# Patient Record
Sex: Female | Born: 1970
Health system: Southern US, Community
[De-identification: ages and names within clinical notes are randomized; demographics above are authoritative.]

## PROBLEM LIST (undated history)

## (undated) DIAGNOSIS — O210 Mild hyperemesis gravidarum: Secondary | ICD-10-CM

## (undated) DIAGNOSIS — L570 Actinic keratosis: Secondary | ICD-10-CM

## (undated) HISTORY — PX: AUGMENTATION MAMMAPLASTY: SUR837

## (undated) HISTORY — DX: Actinic keratosis: L57.0

## (undated) HISTORY — DX: Mild hyperemesis gravidarum: O21.0

---

## 2002-07-20 ENCOUNTER — Encounter: Admission: RE | Admit: 2002-07-20 | Discharge: 2002-07-20 | Payer: Self-pay | Admitting: Family Medicine

## 2002-07-20 ENCOUNTER — Encounter: Payer: Self-pay | Admitting: Family Medicine

## 2002-11-09 ENCOUNTER — Encounter: Admission: RE | Admit: 2002-11-09 | Discharge: 2002-11-09 | Payer: Self-pay | Admitting: Family Medicine

## 2005-01-23 ENCOUNTER — Ambulatory Visit: Payer: Self-pay | Admitting: Family Medicine

## 2005-01-30 ENCOUNTER — Ambulatory Visit: Payer: Self-pay | Admitting: Family Medicine

## 2005-02-25 ENCOUNTER — Ambulatory Visit: Payer: Self-pay | Admitting: Family Medicine

## 2005-03-30 ENCOUNTER — Ambulatory Visit: Payer: Self-pay | Admitting: Family Medicine

## 2005-06-15 ENCOUNTER — Ambulatory Visit: Payer: Self-pay | Admitting: Family Medicine

## 2007-04-05 ENCOUNTER — Ambulatory Visit: Payer: Self-pay | Admitting: Family Medicine

## 2010-02-16 IMAGING — US ABDOMEN ULTRASOUND
1 series · 17 of 25 positions shown · non-contrast
Comparison: none

REASON FOR EXAM: LUQ abd pain
COMMENTS:

[Series 1: abdomen ultrasound · 17 of 65 slices shown]
[im 1/65]
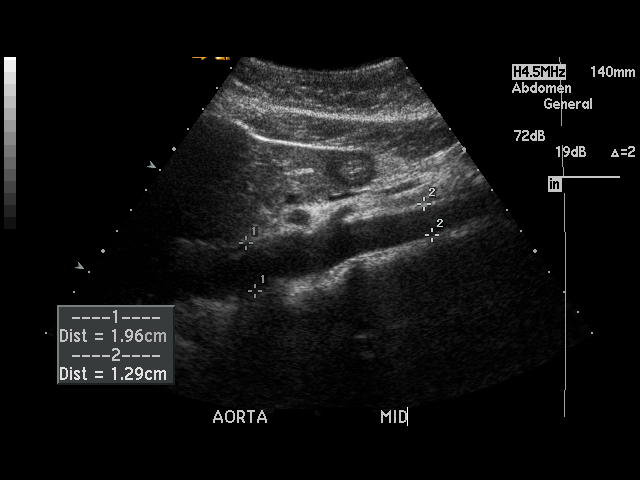
[im 6/65]
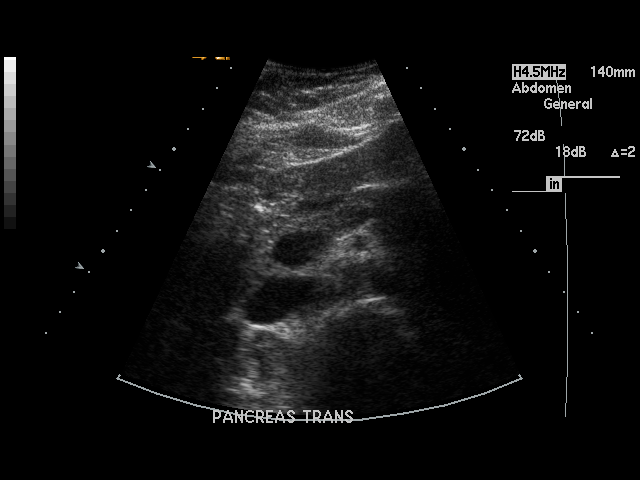
[im 9/65]
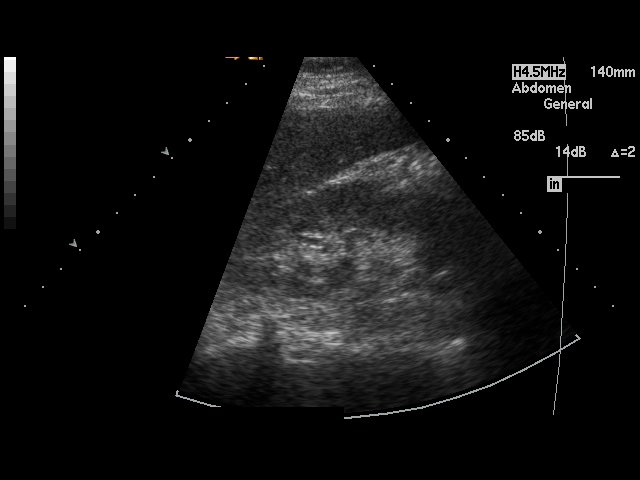
[im 14/65]
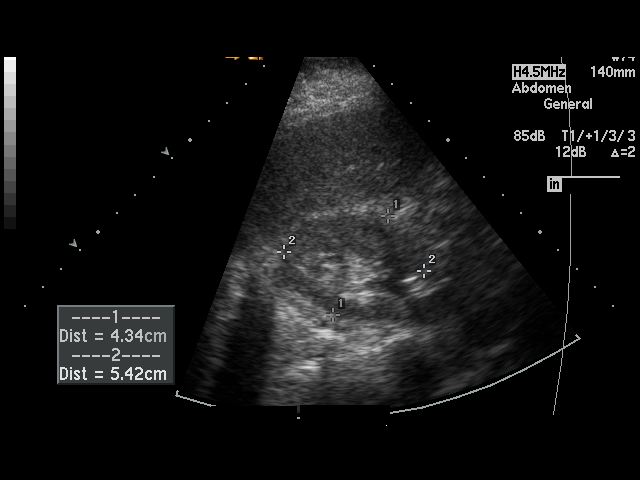
[im 17/65]
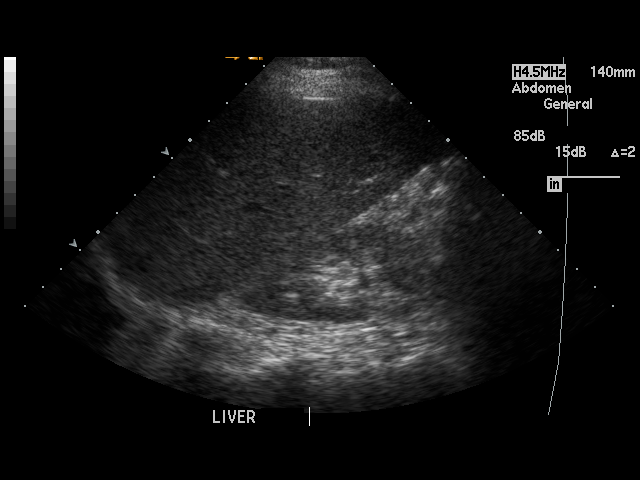
[im 22/65]
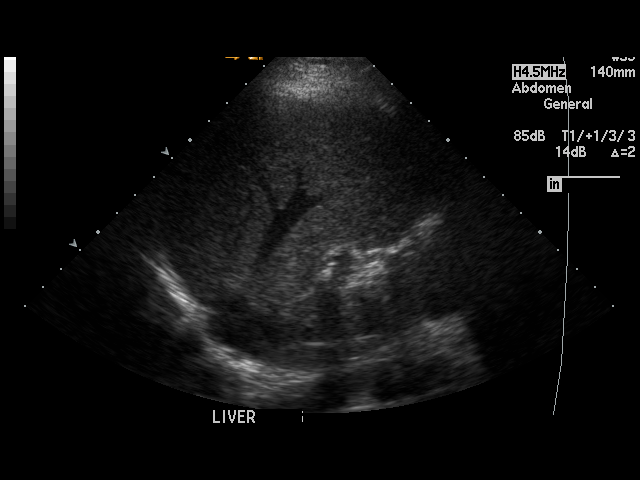
[im 25/65]
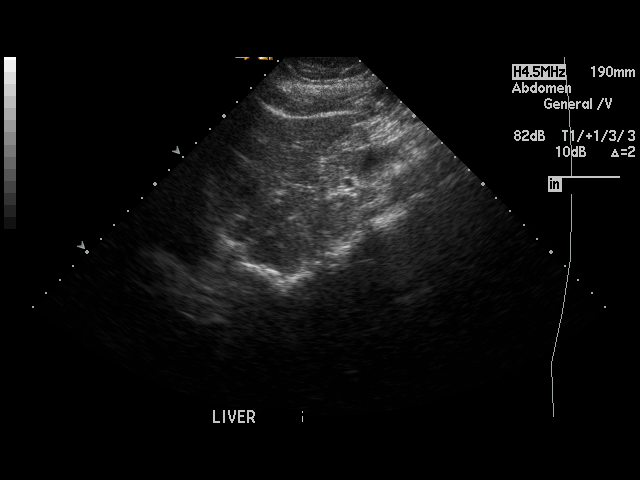
[im 30/65]
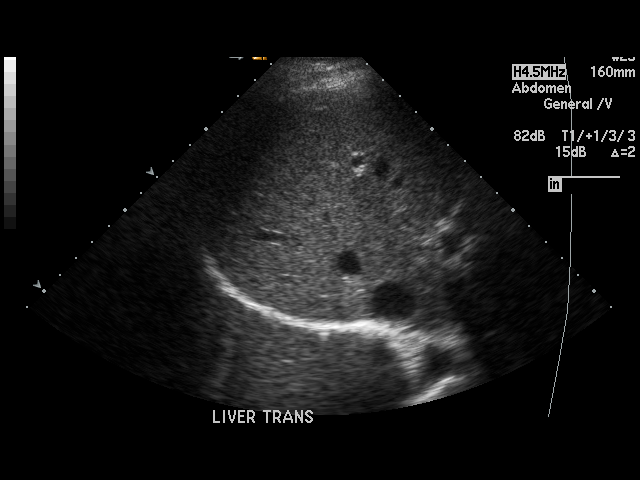
[im 33/65]
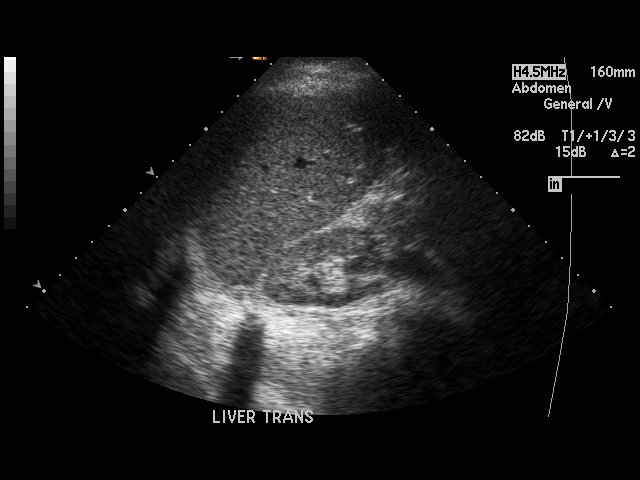
[im 35/65]
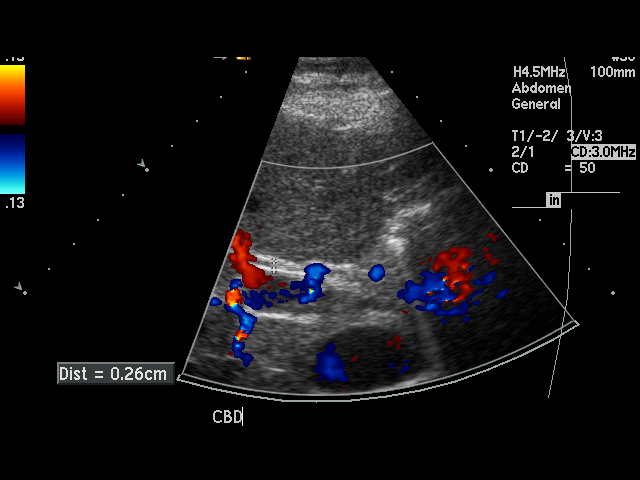
[im 41/65]
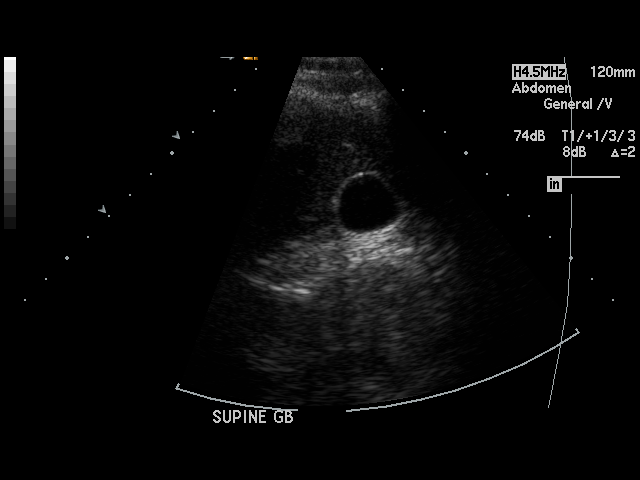
[im 43/65]
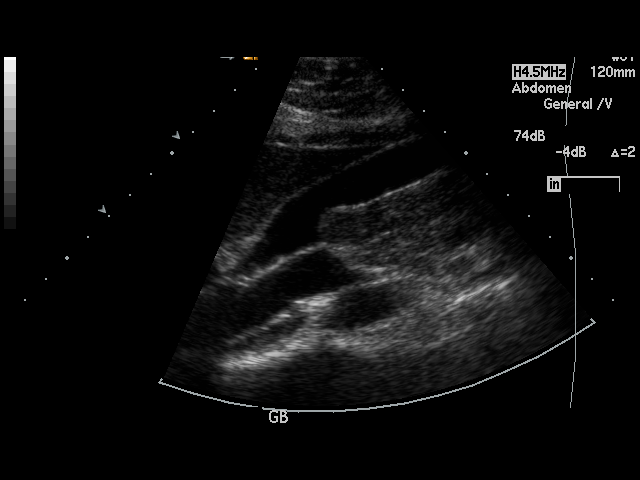
[im 49/65]
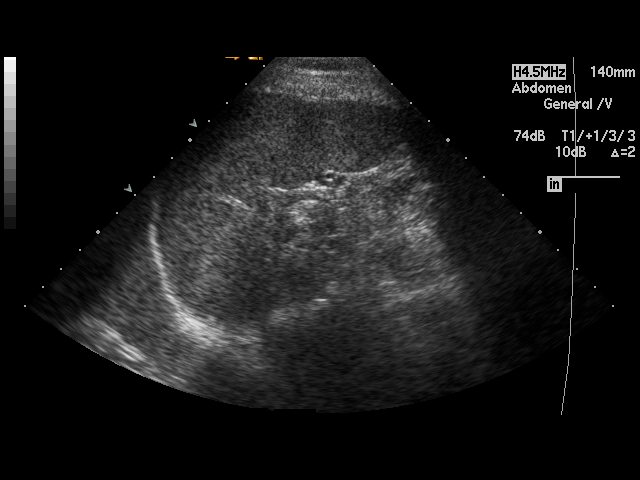
[im 51/65]
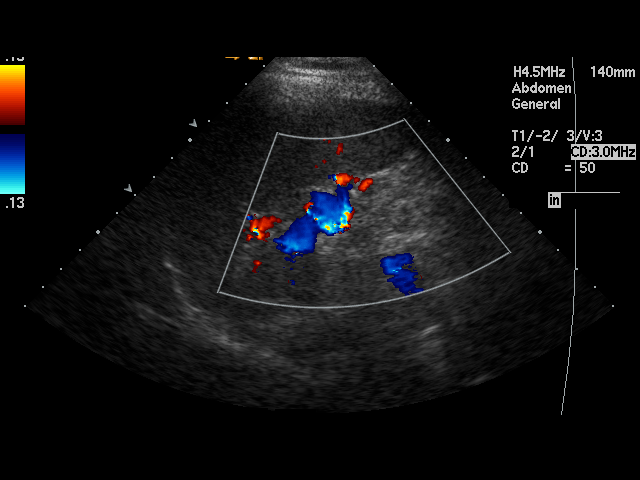
[im 57/65]
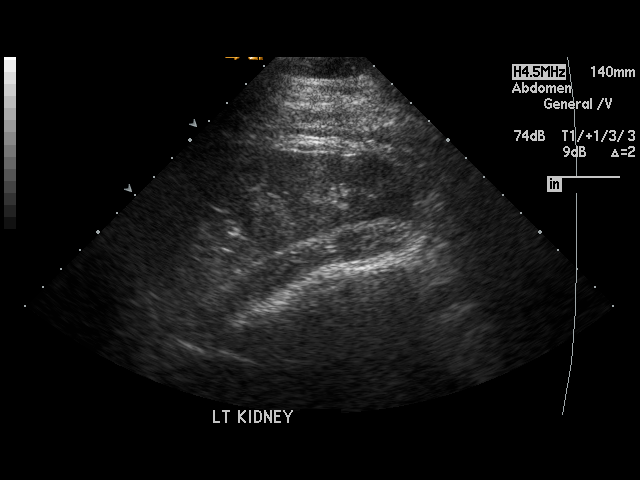
[im 59/65]
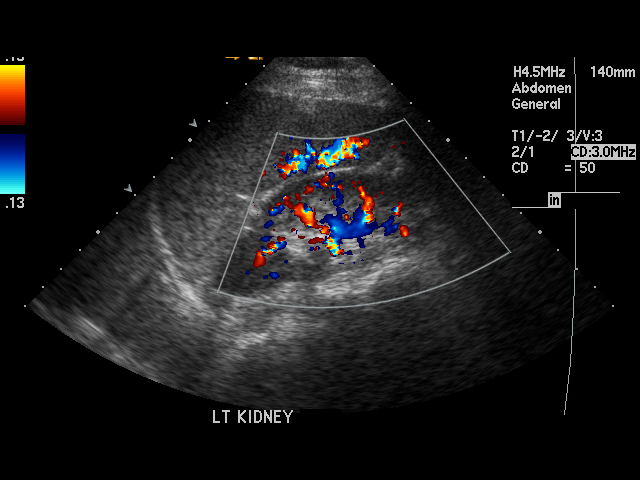
[im 65/65]
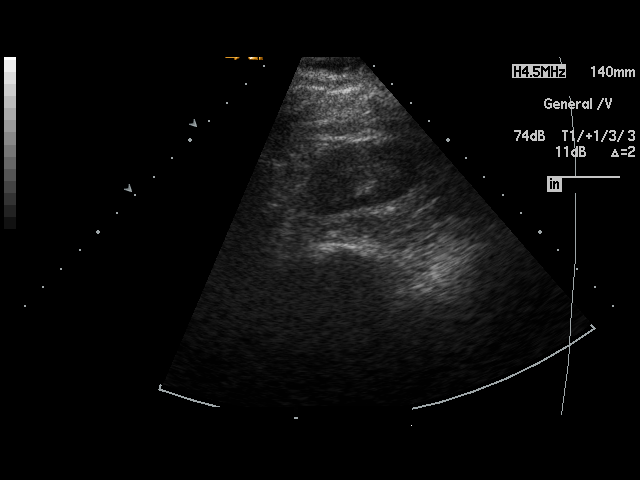

[17 of 25 positions shown; findings below may reference images not displayed]

PROCEDURE:     US  - US ABDOMEN GENERAL SURVEY  - April 05, 2007 [DATE]

RESULT:     The liver, spleen, pancreas, abdominal aorta and inferior vena
cava show no significant abnormalities. No gallstones are seen. There is no
thickening of the gallbladder wall. The common bile duct measures 2.6 mm in
diameter which is within normal limits. The kidneys show no hydronephrosis.
There is no ascites.
IMPRESSION: No significant abnormalities are noted.

## 2011-10-29 ENCOUNTER — Encounter: Payer: Self-pay | Admitting: Internal Medicine

## 2011-11-18 ENCOUNTER — Ambulatory Visit (INDEPENDENT_AMBULATORY_CARE_PROVIDER_SITE_OTHER): Payer: BC Managed Care – PPO | Admitting: Internal Medicine

## 2011-11-18 ENCOUNTER — Other Ambulatory Visit (INDEPENDENT_AMBULATORY_CARE_PROVIDER_SITE_OTHER): Payer: BC Managed Care – PPO

## 2011-11-18 ENCOUNTER — Encounter: Payer: Self-pay | Admitting: Internal Medicine

## 2011-11-18 VITALS — BP 100/60 | HR 60 | Ht 65.0 in | Wt 146.8 lb

## 2011-11-18 DIAGNOSIS — R140 Abdominal distension (gaseous): Secondary | ICD-10-CM

## 2011-11-18 DIAGNOSIS — R143 Flatulence: Secondary | ICD-10-CM

## 2011-11-18 DIAGNOSIS — R630 Anorexia: Secondary | ICD-10-CM

## 2011-11-18 DIAGNOSIS — R141 Gas pain: Secondary | ICD-10-CM

## 2011-11-18 DIAGNOSIS — R6881 Early satiety: Secondary | ICD-10-CM

## 2011-11-18 LAB — COMPREHENSIVE METABOLIC PANEL
ALT: 31 U/L (ref 0–35)
AST: 36 U/L (ref 0–37)
Albumin: 3.9 g/dL (ref 3.5–5.2)
Alkaline Phosphatase: 34 U/L — ABNORMAL LOW (ref 39–117)
BUN: 7 mg/dL (ref 6–23)
CO2: 27 mEq/L (ref 19–32)
Calcium: 9.2 mg/dL (ref 8.4–10.5)
Chloride: 107 mEq/L (ref 96–112)
Creatinine, Ser: 0.7 mg/dL (ref 0.4–1.2)
GFR: 95.53 mL/min (ref >60.00)
Glucose, Bld: 73 mg/dL (ref 70–99)
Potassium: 4.5 mEq/L (ref 3.5–5.1)
Sodium: 141 mEq/L (ref 135–145)
Total Bilirubin: 0.6 mg/dL (ref 0.3–1.2)
Total Protein: 6.7 g/dL (ref 6.0–8.3)

## 2011-11-18 LAB — CBC WITH DIFFERENTIAL/PLATELET
Basophils Absolute: 0 10*3/uL (ref 0.0–0.1)
Basophils Relative: 0.5 % (ref 0.0–3.0)
Eosinophils Absolute: 0 10*3/uL (ref 0.0–0.7)
Eosinophils Relative: 0.6 % (ref 0.0–5.0)
HCT: 39.8 % (ref 36.0–46.0)
Hemoglobin: 13.1 g/dL (ref 12.0–15.0)
Lymphocytes Relative: 30.8 % (ref 12.0–46.0)
Lymphs Abs: 1.8 10*3/uL (ref 0.7–4.0)
MCHC: 32.8 g/dL (ref 30.0–36.0)
MCV: 89.3 fl (ref 78.0–100.0)
Monocytes Absolute: 0.6 10*3/uL (ref 0.1–1.0)
Monocytes Relative: 9.9 % (ref 3.0–12.0)
Neutro Abs: 3.4 10*3/uL (ref 1.4–7.7)
Neutrophils Relative %: 58.2 % (ref 43.0–77.0)
Platelets: 176 10*3/uL (ref 150.0–400.0)
RBC: 4.46 Mil/uL (ref 3.87–5.11)
RDW: 12.8 % (ref 11.5–14.6)
WBC: 5.8 10*3/uL (ref 4.5–10.5)

## 2011-11-18 LAB — TSH: TSH: 0.61 u[IU]/mL (ref 0.35–5.50)

## 2011-11-18 LAB — IGA: IgA: 151 mg/dL (ref 68–378)

## 2011-11-18 NOTE — Progress Notes (Signed)
Subjective:    Patient ID: Kirsten Fletcher, female    DOB: [birth year], 41 y.o.   MRN: [id number]  HPI This is a pleasant married 41 year old white woman who was self-referred for evaluation of bloating. She reports years of issues with early satiety and bloating and distention. He cannot remember a specific change she feels like her some it has always been off and not quite normal. Over the past 2 years she has essentially eliminated most meat from her diet because it is especially bad trigger of this problem. Greasy beef foods seem to be worse, such as a hamburger from a fast food restaurant. She eats and bloating the stands like a balloon she says. She has lost her appetite over time as well though she's never had a strong appetite she has anorexia and can go for hours and forced herself to eat so she doesn't get headaches. She just simply does not get hungry. In the past year or 2 she had some intermittent sharp left upper quadrant pain and did go to an urgent care weren't acid blocking medicine was prescribed, she doesn't room which but that just dissipated on its own she thinks. She eats fiber 1 bars intends to move her bowels without too much difficulty the there could be occasional constipation. There is no rectal bleeding or melena, no nausea or vomiting. She did say she had hyperemesis gravidarum during 2 pregnancies and actually had difficulty maintaining weight. She has never seen a gastroenterologist before. The problems with her diet, are such that it's impairing her quality of life because it's difficult to find something to eat at restaurants, and she simply does not like the way she feels when she has bloating and distention which is worse with meats but has not exclusively related to that.  Allergies  Allergen Reactions  . Shellfish Allergy Anaphylaxis    Swelling of throat  . Latex Rash   No outpatient prescriptions prior to visit.    Last reviewed on 11/18/2011  9:35 AM by Iva Boop, MD Past Medical History  Diagnosis Date  . Hyperemesis gravidarum    History reviewed. No pertinent past surgical history. History   Social History  . Marital Status: Married         Number of Children: 2  .     Occupational History  . real estate agent    Social History Main Topics  . Smoking status: Never Smoker   . Smokeless tobacco: Never Used  . Alcohol Use: No  . Drug Use: No    Social History Narrative   married, 2 daughters, never smoker no alcohol no caffeine, she is a Veterinary surgeon   Family History  Problem Relation Age of Onset  . Diabetes Mother   . Colon cancer Neg Hx   . Diabetes Father           Review of Systems She says she has excessive compulsive tendencies or even disorder. She's not treated for that. She has insomnia and difficulty initiating or maintaining sleep and sleeps about 3-4 hours at night. Remainder of her review of systems is otherwise negative or as per history of present illness. Denies significant stress or anxiety.    Objective:   Physical Exam General:  Well-developed, well-nourished and in no acute distress Eyes:  anicteric. ENT:   Mouth and posterior pharynx free of lesions.  Neck:   supple w/o thyromegaly or mass.  Lungs: Clear to auscultation bilaterally. Heart:  S1S2, no rubs, murmurs,  gallops. Abdomen:  soft, non-tender, no hepatosplenomegaly, hernia, or mass and BS+.  Lymph:  no cervical or supraclavicular adenopathy. Extremities:   no edema Skin   no rash. Neuro:  A&O x 3.  Psych:  appropriate mood and  Affect.      Assessment & Plan:   1. Early satiety   2. Bloating   3. Anorexia    Differential diagnosis is wide of the sounds like some sort of functional dyspepsia most likely. She could have some mild pancreatic insufficiency issues given the meat and tolerance. It does not sound so much like an allergy problem as I would expect the symptoms to be more severe perhaps have swelling or vomiting problems.  He does not have worrisome features like bleeding, dysphagia or unintentional weight loss. In fact her weight has been stable. She could be anemic, lab testing is pending.  1. CBC, comprehensive metabolic panel, TSH, tissue transglutaminase antibody and IgA level rechecked. 2. Her other plans pending those results. If she were anemic an upper endoscopy would be indicated. Additional lab testing as well. 3. I did explain how buspirone is a good treatment for functional dyspepsia with a strong early satiety and bloating component.

## 2011-11-18 NOTE — Patient Instructions (Addendum)
Your physician has requested that you go to the basement for the following lab work before leaving today: CBC/diff, CMET, TSH, IGA, TTG  We will be in touch with results and plans.  Thank you for choosing me and [facility].  Iva Boop, M.D., [facility]

## 2011-11-19 LAB — TISSUE TRANSGLUTAMINASE, IGA: Tissue Transglutaminase Ab, IgA: 2.2 U/mL (ref <20)

## 2011-11-19 NOTE — Progress Notes (Signed)
Quick Note:  Labs all ok - no evidence of celiac disease Please let her know I recommend buspirone 10 mg bid start with 1/2 tablet forst week #60 2 refills REV 6-8weeks  This is the medicine I talked to her about at visit - can help mitigate feelings of early satiety and bloating even though originally an anti-anxiety medication ______

## 2011-11-20 ENCOUNTER — Telehealth: Payer: Self-pay

## 2011-11-20 MED ORDER — BUSPIRONE HCL 10 MG PO TABS: ORAL_TABLET | ORAL | Status: DC

## 2011-11-20 NOTE — Telephone Encounter (Signed)
Notified patient of lab results. Told her that Dr. Leone Payor wanted her to start buspirone that they discussed at there last office visit. Pt agreed and medication sent to her pharmacy. Also scheduled appt for 01/11/12 at 1:30pm.

## 2011-11-20 NOTE — Telephone Encounter (Signed)
Message copied by Jessee Avers on Fri Nov 20, 2011 10:28 AM ------      Message from: Stan Head E      Created: Thu Nov 19, 2011  2:47 PM       Labs all ok - no evidence of celiac disease      Please let her know      I recommend buspirone 10 mg bid start with 1/2 tablet forst week #60 2 refills      REV 6-8weeks            This is the medicine I talked to her about at visit - can help mitigate feelings of early satiety and bloating even though originally an anti-anxiety medication

## 2012-01-11 ENCOUNTER — Ambulatory Visit: Payer: BC Managed Care – PPO | Admitting: Internal Medicine

## 2012-02-27 ENCOUNTER — Other Ambulatory Visit: Payer: Self-pay

## 2012-07-20 DIAGNOSIS — C4491 Basal cell carcinoma of skin, unspecified: Secondary | ICD-10-CM

## 2012-07-20 HISTORY — DX: Basal cell carcinoma of skin, unspecified: C44.91

## 2012-11-17 ENCOUNTER — Other Ambulatory Visit: Payer: Self-pay

## 2013-10-05 DIAGNOSIS — C4492 Squamous cell carcinoma of skin, unspecified: Secondary | ICD-10-CM

## 2013-10-05 HISTORY — DX: Squamous cell carcinoma of skin, unspecified: C44.92

## 2013-11-13 ENCOUNTER — Encounter: Payer: Self-pay | Admitting: Internal Medicine

## 2015-05-01 ENCOUNTER — Encounter: Payer: Self-pay | Admitting: Family Medicine

## 2015-05-01 ENCOUNTER — Ambulatory Visit (INDEPENDENT_AMBULATORY_CARE_PROVIDER_SITE_OTHER): Payer: 59 | Admitting: Family Medicine

## 2015-05-01 VITALS — BP 104/68 | HR 70 | Temp 97.0°F | Ht 65.0 in | Wt 133.4 lb

## 2015-05-01 DIAGNOSIS — Z91013 Allergy to seafood: Secondary | ICD-10-CM | POA: Diagnosis not present

## 2015-05-01 DIAGNOSIS — Z833 Family history of diabetes mellitus: Secondary | ICD-10-CM

## 2015-05-01 DIAGNOSIS — Z13220 Encounter for screening for lipoid disorders: Secondary | ICD-10-CM | POA: Diagnosis not present

## 2015-05-01 MED ORDER — EPINEPHRINE 0.3 MG/0.3ML IJ SOAJ: 0.3000 mg | Freq: Once | INTRAMUSCULAR | Status: DC

## 2015-05-01 NOTE — Assessment & Plan Note (Signed)
Patient with shellfish allergy and possible allergy to some unknown quantity in what she ate over the weekend. She is asymptomatic at this time. Vital signs are stable. Discussed that she would likely need a EpiPen to have at home and this was prescribed to her. She brought up whether or not she needs allergy testing I offered her referral to an allergist and we decided to hold off on this at this time. She will continue as needed Benadryl. She will monitor for recurrence of symptoms. I advised that when she eats out she needs to inform them that she has a shellfish allergy. She will continue to monitor.

## 2015-05-01 NOTE — Progress Notes (Signed)
Pre visit review using our clinic review tool, if applicable. No additional management support is needed unless otherwise documented below in the visit note. 

## 2015-05-01 NOTE — Patient Instructions (Signed)
Nice to meet you. We are going to give you an EpiPen to use as needed for allergic reactions. If you develop tongue swelling, throat swelling, lip swelling, tingling in her mouth, trouble breathing, or trouble swallowing please use the EpiPen. You can also use Benadryl for this. If you develop throat swelling, lip swelling, tongue swelling, tingling in her mouth, trouble breathing, trouble swallowing, or any new or changing symptoms please seek medical attention.

## 2015-05-01 NOTE — Progress Notes (Signed)
Patient ID: Kirsten Fletcher, female   DOB: 09-Apr-1970, 45 y.o.   MRN: [id number]  Kirsten Alar, MD Phone: [phone]  Kirsten Fletcher is a 45 y.o. female who presents today for new patient visit.  Allergic reaction: Patient notes on Saturday she woke up in the middle the night with an allergic reaction. She notes she had some throat swelling, lip swelling, tongue swelling, and trouble breathing. She notes she took a Benadryl and the symptoms resolved with this. Symptoms lasted total of 1.5 hours. She has a history of allergies to shrimp and other shellfish. She had eaten nachos with pimento cheese and a sandwich. She is unsure what she reacted to. She notes no symptoms at this time. She has never had an EpiPen. Her symptoms have always resolved with taking Benadryl.  Active Ambulatory Problems    Diagnosis Date Noted  . Shellfish allergy 05/01/2015   Resolved Ambulatory Problems    Diagnosis Date Noted  . No Resolved Ambulatory Problems   Past Medical History  Diagnosis Date  . Hyperemesis gravidarum     Family History  Problem Relation Age of Onset  . Diabetes Mother   . Colon cancer Neg Hx   . Diabetes Father   . Diabetes Maternal Grandmother   . Diabetes Maternal Grandfather   . Diabetes Paternal Grandmother   . Diabetes Paternal Grandfather     Social History   Social History  . Marital Status: Married    Spouse Name: N/A  . Number of Children: 2  . Years of Education: N/A   Occupational History  . real estate agent    Social History Main Topics  . Smoking status: Never Smoker   . Smokeless tobacco: Never Used  . Alcohol Use: No  . Drug Use: No  . Sexual Activity: Not on file   Other Topics Concern  . Not on file   Social History Narrative   married, 2 daughters, never smoker no alcohol no caffeine, she is a Veterinary surgeon    ROS  General:  Negative for nexplained weight loss, fever Skin: Negative for new or changing mole, sore that won't heal HEENT: Negative  for trouble hearing, trouble seeing, ringing in ears, mouth sores, hoarseness, change in voice, dysphagia. CV:  Negative for chest pain, edema, palpitations Resp: Positive for dyspnea, Negative for cough, hemoptysis GI: Positive for nausea, negative for vomiting, diarrhea, constipation, abdominal pain, melena, hematochezia. GU: Negative for dysuria, incontinence, urinary hesitance, hematuria, vaginal or penile discharge, polyuria, sexual difficulty, lumps in testicle or breasts MSK: Negative for muscle cramps or aches, joint pain or swelling Neuro: Negative for headaches, weakness, numbness, dizziness, passing out/fainting Psych: Negative for depression, anxiety, memory problems  Objective  Physical Exam Filed Vitals:   05/01/15 1026  BP: 104/68  Pulse: 70  Temp: 97 F (36.1 C)    BP Readings from Last 3 Encounters:  05/01/15 104/68  11/18/11 100/60   Wt Readings from Last 3 Encounters:  05/01/15 133 lb 6.4 oz (60.51 kg)  11/18/11 146 lb 12.8 oz (66.588 kg)    Physical Exam  Constitutional: She is well-developed, well-nourished, and in no distress.  HENT:  Head: Normocephalic and atraumatic.  Right Ear: External ear normal.  Left Ear: External ear normal.  Mouth/Throat: Oropharynx is clear and moist. No oropharyngeal exudate.  No tongue or lip enlargement  Eyes: Conjunctivae are normal. Pupils are equal, round, and reactive to light.  Neck: Neck supple.  Cardiovascular: Normal rate, regular rhythm and normal heart sounds.  Pulmonary/Chest: Effort normal and breath sounds normal.  Abdominal: Soft. Bowel sounds are normal. She exhibits no distension. There is no tenderness. There is no rebound and no guarding.  Musculoskeletal: She exhibits no edema.  Lymphadenopathy:    She has no cervical adenopathy.  Neurological: She is alert. Gait normal.  Skin: Skin is warm and dry. She is not diaphoretic.  Psychiatric: Mood and affect normal.     Assessment/Plan:   Shellfish  allergy Patient with shellfish allergy and possible allergy to some unknown quantity in what she ate over the weekend. She is asymptomatic at this time. Vital signs are stable. Discussed that she would likely need a EpiPen to have at home and this was prescribed to her. She brought up whether or not she needs allergy testing I offered her referral to an allergist and we decided to hold off on this at this time. She will continue as needed Benadryl. She will monitor for recurrence of symptoms. I advised that when she eats out she needs to inform them that she has a shellfish allergy. She will continue to monitor.    Orders Placed This Encounter  Procedures  . Comp Met (CMET)    Standing Status: Future     Number of Occurrences:      Standing Expiration Date: 04/30/2016  . HgB A1c    Standing Status: Future     Number of Occurrences:      Standing Expiration Date: 04/30/2016  . Lipid Profile    Standing Status: Future     Number of Occurrences:      Standing Expiration Date: 04/30/2016    Meds ordered this encounter  Medications  . EPINEPHrine 0.3 mg/0.3 mL IJ SOAJ injection    Sig: Inject 0.3 mLs (0.3 mg total) into the muscle once.    Dispense:  2 Device    Refill:  0     Kirsten Rumps, MD [place]

## 2015-05-24 ENCOUNTER — Other Ambulatory Visit (INDEPENDENT_AMBULATORY_CARE_PROVIDER_SITE_OTHER): Payer: 59

## 2015-05-24 DIAGNOSIS — Z13220 Encounter for screening for lipoid disorders: Secondary | ICD-10-CM | POA: Diagnosis not present

## 2015-05-24 DIAGNOSIS — Z833 Family history of diabetes mellitus: Secondary | ICD-10-CM | POA: Diagnosis not present

## 2015-05-24 LAB — COMPREHENSIVE METABOLIC PANEL
ALT: 22 U/L (ref 0–35)
AST: 24 U/L (ref 0–37)
Albumin: 4.1 g/dL (ref 3.5–5.2)
Alkaline Phosphatase: 33 U/L — ABNORMAL LOW (ref 39–117)
BUN: 10 mg/dL (ref 6–23)
CO2: 26 mEq/L (ref 19–32)
Calcium: 9.2 mg/dL (ref 8.4–10.5)
Chloride: 108 mEq/L (ref 96–112)
Creatinine, Ser: 0.89 mg/dL (ref 0.40–1.20)
GFR: 73.52 mL/min (ref >60.00)
Glucose, Bld: 86 mg/dL (ref 70–99)
Potassium: 4.3 mEq/L (ref 3.5–5.1)
Sodium: 140 mEq/L (ref 135–145)
Total Bilirubin: 0.7 mg/dL (ref 0.2–1.2)
Total Protein: 6.6 g/dL (ref 6.0–8.3)

## 2015-05-24 LAB — LIPID PANEL
Cholesterol: 190 mg/dL (ref 0–200)
HDL: 61.9 mg/dL (ref >39.00)
LDL Cholesterol: 115 mg/dL — ABNORMAL HIGH (ref 0–99)
NonHDL: 128.52
Total CHOL/HDL Ratio: 3
Triglycerides: 70 mg/dL (ref 0.0–149.0)
VLDL: 14 mg/dL (ref 0.0–40.0)

## 2015-05-24 LAB — HEMOGLOBIN A1C: Hgb A1c MFr Bld: 5.1 % (ref 4.6–6.5)

## 2015-05-29 ENCOUNTER — Encounter: Payer: Self-pay | Admitting: Family Medicine

## 2015-05-31 ENCOUNTER — Ambulatory Visit (INDEPENDENT_AMBULATORY_CARE_PROVIDER_SITE_OTHER): Payer: 59 | Admitting: Family Medicine

## 2015-05-31 ENCOUNTER — Encounter: Payer: Self-pay | Admitting: Family Medicine

## 2015-05-31 VITALS — BP 104/70 | HR 70 | Temp 98.0°F | Ht 65.0 in | Wt 133.2 lb

## 2015-05-31 DIAGNOSIS — Z23 Encounter for immunization: Secondary | ICD-10-CM

## 2015-05-31 DIAGNOSIS — Z0001 Encounter for general adult medical examination with abnormal findings: Secondary | ICD-10-CM

## 2015-05-31 DIAGNOSIS — Z0000 Encounter for general adult medical examination without abnormal findings: Secondary | ICD-10-CM | POA: Insufficient documentation

## 2015-05-31 DIAGNOSIS — R6889 Other general symptoms and signs: Secondary | ICD-10-CM | POA: Diagnosis not present

## 2015-05-31 MED ORDER — WRIST SPLINT/COCK-UP/RIGHT M MISC: Status: DC

## 2015-05-31 NOTE — Progress Notes (Signed)
Pre visit review using our clinic review tool, if applicable. No additional management support is needed unless otherwise documented below in the visit note. 

## 2015-05-31 NOTE — Patient Instructions (Signed)
Nice to see you. Please continue to exercise and monitor your diet. Please follow-up with her gynecologist for yearly mammogram and Pap smear. Please try wrapping a towel around your elbows or keeping a towel between your elbows on both sides to help prevent irritation of your ulnar nerve. You can also try wearing the cockup splint on your right wrist.

## 2015-05-31 NOTE — Assessment & Plan Note (Signed)
Overall doing quite well. BMI is in the normal range. She'll continue diet and exercise. Tetanus vaccine was updated today. She will continue to obtain her Pap smears and mammograms through her gynecologist. I encouraged her to continue to discuss her menstrual cycle with them. Deferred HIV testing today. Suspect the numbness that she gets in her hands is related to positional issues and ulnar nerve entrapment at her elbows and possibly carpal tunnel on the right wrist. I discussed wrapping her elbows in towels or sleeping with a pillow in the elbow area to prevent further entrapment and also try to leave her arms extended as much as possible. Also provided her with a prescription for a splint to use if necessary for her right wrist. If no improvement she'll follow-up. She is given return precautions.

## 2015-05-31 NOTE — Progress Notes (Signed)
Patient ID: IVELIS MIMBS, female   DOB: 24-May-1970, 45 y.o.   MRN: [id number]  Kirsten Rumps, MD Phone: [phone]  Kirsten Fletcher is a 45 y.o. female who presents today for physical exam.  Patient exercises by running and doing high-intensity workouts 3-5 days a week. Diet is described as good. No added sugar. No soda or sweet tea. He eats lots of vegetables. No recent tetanus vaccine. She has her mammograms and Pap smears managed through her gynecologist. First menstrual period at age 63. Some irregularity to her periods and that sometimes it just spotting and sometimes it is normal. She is seen by her gynecologist for this and they've advised to monitor. No illicit drug use. No alcohol use. No tobacco use. No prior HIV screening.  Does note when she sleeps at night occasionally her hands will go numb based on the position that she sleeps in. She wakes up and stretches her arms out and the numbness goes away. No pain in her elbows or wrists. No other numbness or weakness elsewhere. Weekly occurs 3 nights a week.  Active Ambulatory Problems    Diagnosis Date Noted  . Shellfish allergy 05/01/2015  . Encounter for general adult medical examination with abnormal findings 05/31/2015   Resolved Ambulatory Problems    Diagnosis Date Noted  . No Resolved Ambulatory Problems   Past Medical History  Diagnosis Date  . Hyperemesis gravidarum     Family History  Problem Relation Age of Onset  . Diabetes Mother   . Colon cancer Neg Hx   . Diabetes Father   . Diabetes Maternal Grandmother   . Diabetes Maternal Grandfather   . Diabetes Paternal Grandmother   . Diabetes Paternal Grandfather     Social History   Social History  . Marital Status: Married    Spouse Name: N/A  . Number of Children: 2  . Years of Education: N/A   Occupational History  . real estate agent    Social History Main Topics  . Smoking status: Never Smoker   . Smokeless tobacco: Never Used  . Alcohol Use:  No  . Drug Use: No  . Sexual Activity: Not on file   Other Topics Concern  . 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 CV:  Negative for chest pain, dyspnea, edema, palpitations Resp: Negative for cough, dyspnea, hemoptysis GI: Negative for nausea, vomiting, diarrhea, constipation, abdominal pain, melena, hematochezia. GU: Negative for dysuria, incontinence, urinary hesitance, hematuria, vaginal or penile discharge, polyuria, sexual difficulty, lumps in testicle or breasts MSK: Negative for muscle cramps or aches, joint pain or swelling Neuro: Positive for numbness, Negative for headaches, weakness, dizziness, passing out/fainting Psych: Negative for depression, anxiety, memory problems  Objective  Physical Exam Filed Vitals:   05/31/15 0929  BP: 104/70  Pulse: 70  Temp: 98 F (36.7 C)    BP Readings from Last 3 Encounters:  05/31/15 104/70  05/01/15 104/68  11/18/11 100/60   Wt Readings from Last 3 Encounters:  05/31/15 133 lb 3.2 oz (60.419 kg)  05/01/15 133 lb 6.4 oz (60.51 kg)  11/18/11 146 lb 12.8 oz (66.588 kg)    Physical Exam  Constitutional:  She is well-developed, well-nourished, and in no distress.  HENT:  Head: Normocephalic and atraumatic.  Right Ear: External ear normal.  Left Ear: External ear normal.  Mouth/Throat: Oropharynx is clear and moist.  Eyes: Conjunctivae are normal. Pupils are equal, round, and reactive to light.  Neck: Neck supple.  Cardiovascular: Normal rate, regular rhythm and normal heart sounds.   Pulmonary/Chest: Effort normal and breath sounds normal.  Abdominal: Soft. Bowel sounds are normal. She exhibits no  distension. There is no tenderness. There is no rebound and no guarding.  Musculoskeletal:  No elbow or wrist tenderness bilaterally, no elbow or wrist swelling bilaterally, positive Tinel's of bilateral ulnar grooves, positive Tinel's at right wrist, negative Tinel's at left wrist  Lymphadenopathy:    She has no cervical adenopathy.  Neurological: She is alert.  CN 2-12 intact, 5/5 strength in bilateral biceps, triceps, grip, quads, hamstrings, plantar and dorsiflexion, sensation to light touch intact in bilateral UE and LE, normal gait, 2+ patellar reflexes  Skin: Skin is warm and dry. She is not diaphoretic.  Psychiatric: Mood and affect normal.     Assessment/Plan:   Encounter for general adult medical examination with abnormal findings Overall doing quite well. BMI is in the normal range. She'll continue diet and exercise. Tetanus vaccine was updated today. She will continue to obtain her Pap smears and mammograms through her gynecologist. I encouraged her to continue to discuss her menstrual cycle with them. Deferred HIV testing today. Suspect the numbness that she gets in her hands is related to positional issues and ulnar nerve entrapment at her elbows and possibly carpal tunnel on the right wrist. I discussed wrapping her elbows in towels or sleeping with a pillow in the elbow area to prevent further entrapment and also try to leave her arms extended as much as possible. Also provided her with a prescription for a splint to use if necessary for her right wrist. If no improvement she'll follow-up. She is given return precautions.    Orders Placed This Encounter  Procedures  . Tdap vaccine greater than or equal to 7yo IM    Kirsten Rumps, MD [place]

## 2015-06-27 DIAGNOSIS — D239 Other benign neoplasm of skin, unspecified: Secondary | ICD-10-CM

## 2015-06-27 HISTORY — DX: Other benign neoplasm of skin, unspecified: D23.9

## 2016-07-12 DIAGNOSIS — L299 Pruritus, unspecified: Secondary | ICD-10-CM | POA: Diagnosis not present

## 2016-07-12 DIAGNOSIS — R42 Dizziness and giddiness: Secondary | ICD-10-CM | POA: Diagnosis not present

## 2016-07-12 DIAGNOSIS — T673XXA Heat exhaustion, anhydrotic, initial encounter: Secondary | ICD-10-CM | POA: Diagnosis not present

## 2016-07-12 DIAGNOSIS — T7840XA Allergy, unspecified, initial encounter: Secondary | ICD-10-CM | POA: Diagnosis not present

## 2016-07-12 DIAGNOSIS — R55 Syncope and collapse: Secondary | ICD-10-CM | POA: Diagnosis not present

## 2016-07-12 DIAGNOSIS — R0602 Shortness of breath: Secondary | ICD-10-CM | POA: Diagnosis not present

## 2016-07-13 DIAGNOSIS — R55 Syncope and collapse: Secondary | ICD-10-CM | POA: Diagnosis not present

## 2016-08-11 DIAGNOSIS — Z01419 Encounter for gynecological examination (general) (routine) without abnormal findings: Secondary | ICD-10-CM | POA: Diagnosis not present

## 2016-08-11 DIAGNOSIS — Z1231 Encounter for screening mammogram for malignant neoplasm of breast: Secondary | ICD-10-CM | POA: Diagnosis not present

## 2016-08-11 LAB — HM PAP SMEAR: HM Pap smear: NEGATIVE

## 2016-08-12 ENCOUNTER — Encounter: Payer: Self-pay | Admitting: Family Medicine

## 2016-08-12 ENCOUNTER — Ambulatory Visit (INDEPENDENT_AMBULATORY_CARE_PROVIDER_SITE_OTHER): Payer: 59 | Admitting: Family Medicine

## 2016-08-12 VITALS — BP 100/64 | HR 71 | Temp 98.7°F | Wt 140.4 lb

## 2016-08-12 DIAGNOSIS — R002 Palpitations: Secondary | ICD-10-CM | POA: Diagnosis not present

## 2016-08-12 LAB — TSH: TSH: 1.53 u[IU]/mL (ref 0.35–4.50)

## 2016-08-12 NOTE — Assessment & Plan Note (Signed)
Patient with several episodes of palpitations and some slight exertional shortness of breath over the last month or so. Evaluation in the ED was unremarkable. That episode could have been related to heat exhaustion though she has had some persistent symptoms. Lab work and EKG was reassuring then. We'll check a TSH as this was not checked. Given persistence of symptoms we'll refer to cardiology. She is given return precautions.

## 2016-08-12 NOTE — Patient Instructions (Signed)
Nice to see you. We'll check a TSH today. We'll get you to see cardiology. If you develop recurrent dyspnea, or develop chest pain or persistent palpitations please be evaluated.

## 2016-08-12 NOTE — Progress Notes (Signed)
  Tommi Rumps, MD Phone: [phone]  Kirsten Fletcher is a 46 y.o. female who presents today for follow-up.  Patient recently seen in the ED at [facility] for shortness of breath. She developed sudden onset of feeling as though she couldn't breathe. Felt as though she was going to pass out as well. She did not pass out. She notes since then she's had some slight dyspnea on exertion at times though other times is fine. Finds it a little harder to breathe at those times. She has yes not had any recurrent episodes like she did that required an emergency room visit. She notes no chest pain. Occasionally does feel a flutter in her chest when she lays down but not when she is active. Lasts for about a second and then has a little trouble breathing and then it resolves. No family history of cardiac issues or pulmonary issues. No history of asthma and the patient. Her lab work was unremarkable in the emergency room and they treated her for heat exhaustion with IV fluids. ED note reviewed. Lab work reviewed as well.  PMH: nonsmoker.   ROS see history of present illness  Objective  Physical Exam Vitals:   08/12/16 0847  BP: 100/64  Pulse: 71  Temp: 98.7 F (37.1 C)    BP Readings from Last 3 Encounters:  08/12/16 100/64  05/31/15 104/70  05/01/15 104/68   Wt Readings from Last 3 Encounters:  08/12/16 140 lb 6.4 oz (63.7 kg)  05/31/15 133 lb 3.2 oz (60.4 kg)  05/01/15 133 lb 6.4 oz (60.5 kg)    Physical Exam  Constitutional: No distress.  Cardiovascular: Normal rate, regular rhythm and normal heart sounds.   Pulmonary/Chest: Effort normal and breath sounds normal.  Musculoskeletal: She exhibits no edema.  Neurological: She is alert. Gait normal.  Skin: Skin is warm and dry. She is not diaphoretic.     Assessment/Plan: Please see individual problem list.  Palpitations Patient with several episodes of palpitations and some slight exertional shortness of breath over the last month  or so. Evaluation in the ED was unremarkable. That episode could have been related to heat exhaustion though she has had some persistent symptoms. Lab work and EKG was reassuring then. We'll check a TSH as this was not checked. Given persistence of symptoms we'll refer to cardiology. She is given return precautions.   Orders Placed This Encounter  Procedures  . TSH  . Ambulatory referral to Cardiology    Referral Priority:   Routine    Referral Type:   Consultation    Referral Reason:   Specialty Services Required    Requested Specialty:   Cardiology    Number of Visits Requested:   Webb, MD [place]

## 2016-09-02 DIAGNOSIS — Z1283 Encounter for screening for malignant neoplasm of skin: Secondary | ICD-10-CM | POA: Diagnosis not present

## 2016-09-02 DIAGNOSIS — L578 Other skin changes due to chronic exposure to nonionizing radiation: Secondary | ICD-10-CM | POA: Diagnosis not present

## 2016-09-02 DIAGNOSIS — D485 Neoplasm of uncertain behavior of skin: Secondary | ICD-10-CM | POA: Diagnosis not present

## 2016-10-28 ENCOUNTER — Ambulatory Visit (INDEPENDENT_AMBULATORY_CARE_PROVIDER_SITE_OTHER): Payer: 59 | Admitting: Internal Medicine

## 2016-10-28 ENCOUNTER — Encounter: Payer: Self-pay | Admitting: Internal Medicine

## 2016-10-28 VITALS — BP 122/68 | HR 85 | Ht 66.0 in | Wt 139.2 lb

## 2016-10-28 DIAGNOSIS — R002 Palpitations: Secondary | ICD-10-CM | POA: Diagnosis not present

## 2016-10-28 DIAGNOSIS — R5381 Other malaise: Secondary | ICD-10-CM | POA: Diagnosis not present

## 2016-10-28 NOTE — Progress Notes (Signed)
New Outpatient Visit Date: 10/28/2016  Referring Provider: Leone Haven, MD [address] [place], [place] [address]  Chief Complaint: Palpitations  HPI:  Ms. Kirsten Fletcher is a 46 y.o. female who is being seen today for the evaluation of palpitations at the request of Dr. Caryl Bis. She has no significant past medical history. Ms. [name] reports being very active, exercising at least 6 days a week, without any symptoms. She runs regularly and notes that her heart rate seems to plateau at 140 bpm. She does not have any exertional symptoms, including chest pain, shortness of breath, palpitations, and lightheadedness. She notes a single episode of malaise this summer while helping to move one of her daughters. She awoke in the morning with mild nausea and indigestion and later in the day felt fatigued and lightheaded. She presented to the emergency department in [place], [place], and was diagnosed with heat exhaustion. She was seen for follow-up by Dr. Caryl Bis in early August, at which time she reported continued slight dyspnea on exertion as well as occasional fluttering in her chest when she lies down. Today, [name] denies any further episodes of shortness of breath. She notes a brief flutter in her chest when lying down that occurs about once a month. The sensation lasts for only 1 or 2 seconds and does not have any accompanying symptoms.  [name] denies a history of cardiovascular disease or prior testing. She has a strong family history of type 2 diabetes mellitus but does not have any close relatives with a history of heart disease. Because of her family history of diabetes, she has tried to cut down on sugar intake over the last few years.  --------------------------------------------------------------------------------------------------  Cardiovascular History & Procedures: Cardiovascular Problems:  Palpitations  Risk Factors:  None  Cath/PCI:  None  CV  Surgery:  None  EP Procedures and Devices:  None  Non-Invasive Evaluation(s):  None  Recent CV Pertinent Labs: Lab Results  Component Value Date   CHOL 190 05/24/2015   HDL 61.90 05/24/2015   LDLCALC 115 (H) 05/24/2015   TRIG 70.0 05/24/2015   CHOLHDL 3 05/24/2015   K 4.3 05/24/2015   BUN 10 05/24/2015   CREATININE 0.89 05/24/2015   --------------------------------------------------------------------------------------------------  Past Medical History:  Diagnosis Date  . Hyperemesis gravidarum     Past Surgical History: None  Current Meds  Medication Sig  . drospirenone-ethinyl estradiol (YAZ,GIANVI,LORYNA) 3-0.02 MG tablet Take 1 tablet by mouth daily.  Marland Kitchen EPINEPHrine 0.3 mg/0.3 mL IJ SOAJ injection Inject 0.3 mLs (0.3 mg total) into the muscle once.    Allergies: Shellfish allergy and Latex  Social History   Social History  . Marital status: Married    Spouse name: N/A  . Number of children: 2  . Years of education: N/A   Occupational History  . real estate agent    Social History Main Topics  . Smoking status: Never Smoker  . Smokeless tobacco: Never Used  . Alcohol use Not on file  . Drug use: No  . Sexual activity: Not on file   Other Topics Concern  . 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 Diabetes Mother   . Diabetes Father   . Diabetes Maternal Grandmother   . Diabetes Maternal Grandfather   . Diabetes Paternal Grandmother   . Diabetes Paternal Grandfather   .  Diabetes Brother   . Heart disease Maternal Aunt   . Colon cancer Neg Hx     Review of Systems: A 12-system review of systems was performed and was negative except as noted in the HPI.  --------------------------------------------------------------------------------------------------  Physical Exam: BP 122/68 (BP Location: Right Arm, Patient  Position: Sitting, Cuff Size: Normal)   Pulse 85   Ht 5\' 6"  (1.676 m)   Wt 139 lb 4 oz (63.2 kg)   BMI 22.48 kg/m   General:  Well-developed, well-nourished woman seated comfortably in the exam room. HEENT: No conjunctival pallor or scleral icterus. Moist mucous membranes. OP clear. Neck: Supple without lymphadenopathy, thyromegaly, JVD, or HJR. Lungs: Normal work of breathing. Clear to auscultation bilaterally without wheezes or crackles. Heart: Regular rate and rhythm without murmurs, rubs, or gallops. Non-displaced PMI. Abd: Bowel sounds present. Soft, NT/ND without hepatosplenomegaly Ext: No lower extremity edema. Radial, PT, and DP pulses are 2+ bilaterally Skin: Warm and dry without rash. Neuro: CNIII-XII intact. Strength and fine-touch sensation intact in upper and lower extremities bilaterally. Psych: Normal mood and affect.  EKG:  Normal sinus rhythm without abnormalities.  Lab Results  Component Value Date   WBC 5.8 11/18/2011   HGB 13.1 11/18/2011   HCT 39.8 11/18/2011   MCV 89.3 11/18/2011   PLT 176.0 11/18/2011    Lab Results  Component Value Date   NA 140 05/24/2015   K 4.3 05/24/2015   CL 108 05/24/2015   CO2 26 05/24/2015   BUN 10 05/24/2015   CREATININE 0.89 05/24/2015   GLUCOSE 86 05/24/2015   ALT 22 05/24/2015    Lab Results  Component Value Date   CHOL 190 05/24/2015   HDL 61.90 05/24/2015   LDLCALC 115 (H) 05/24/2015   TRIG 70.0 05/24/2015   CHOLHDL 3 05/24/2015   Lab Results  Component Value Date   TSH 1.53 08/12/2016   Outside labs (07/12/16): CMP: Sodium 137, potassium 3.7, chloride 106, CO2 23, BUN 11, creatinine 0.9, glucose 93, calcium 8.8, AST 20, ALT 14, alkaline phosphatase 30, total bilirubin 0.4, total protein 6.3, albumin 3.6  CBC: WBC 7.5, hemoglobin 13.1, hematocrit 38.2, platelets 169  D-dimer: <100  --------------------------------------------------------------------------------------------------  ASSESSMENT AND  PLAN: Palpitations Ms. Mcglothlin reports a few episodes of brief fluttering in her chest, occurring about once a month. There are no accompanying symptoms. I suspect that these episodes may reflect PACs or PVCs. Her EKG today is normal without arrhythmias. Given there brief and infrequent occurrence of her palpitations, we have deferred further testing. Of note, TSH in August was normal.  Malaise I am not sure what precipitated Ms. Jasinski's episode of nausea and malaise that led to ED visit in early July. Her symptoms have not recurred. She exercises regularly and has no other symptoms to suggest significant cardiac disease. Her exam today is normal, as is her EKG. Unless she has recurrent symptoms, I do not think that further testing is necessary, given Ms. Ellwood's low pretest probability. I recommend continued exercise and risk factor modification for primary prevention.  Follow-up: Return to clinic as needed.  Nelva Bush, MD 10/28/2016 3:41 PM

## 2016-10-28 NOTE — Patient Instructions (Signed)
Medication Instructions:  Your physician recommends that you continue on your current medications as directed. Please refer to the Current Medication list given to you today.   Labwork: none  Testing/Procedures: None.  Follow-Up: Your physician recommends that you schedule a follow-up appointment as needed.    Any Other Special Instructions Will Be Listed Below (If Applicable).     If you need a refill on your cardiac medications before your next appointment, please call your pharmacy.

## 2017-03-05 DIAGNOSIS — M791 Myalgia, unspecified site: Secondary | ICD-10-CM | POA: Diagnosis not present

## 2017-03-05 DIAGNOSIS — T6291XA Toxic effect of unspecified noxious substance eaten as food, accidental (unintentional), initial encounter: Secondary | ICD-10-CM | POA: Diagnosis not present

## 2017-03-05 DIAGNOSIS — R197 Diarrhea, unspecified: Secondary | ICD-10-CM | POA: Diagnosis not present

## 2017-05-03 ENCOUNTER — Other Ambulatory Visit: Payer: Self-pay

## 2017-05-03 ENCOUNTER — Encounter: Payer: Self-pay | Admitting: Family Medicine

## 2017-05-03 ENCOUNTER — Ambulatory Visit: Payer: 59 | Admitting: Family Medicine

## 2017-05-03 DIAGNOSIS — J310 Chronic rhinitis: Secondary | ICD-10-CM | POA: Diagnosis not present

## 2017-05-03 DIAGNOSIS — R002 Palpitations: Secondary | ICD-10-CM

## 2017-05-03 MED ORDER — FLUTICASONE PROPIONATE 50 MCG/ACT NA SUSP: 1.0000 | Freq: Every day | NASAL | 0 refills | Status: DC

## 2017-05-03 NOTE — Assessment & Plan Note (Signed)
Sounds to be PVC or PAC related.  No symptoms.  She is quite active.  She has seen cardiology if these become more frequent or she develops new symptoms we will have her return to see cardiology.  Given return precautions.

## 2017-05-03 NOTE — Assessment & Plan Note (Signed)
Allergic versus vasomotor.  Erythematous and edematous nasal mucosa.  We will trial Flonase 1 spray each nostril daily.  If not improving after 1-2 weeks she can increase to 2 sprays each nostril and let us know if not improving.

## 2017-05-03 NOTE — Patient Instructions (Signed)
Nice to see you. Please try the Flonase to help with your runny nose.  If this is not beneficial please let us know. If you develop persistent palpitations, chest pain, trouble breathing, or any new or changing symptoms please seek medical attention.

## 2017-05-03 NOTE — Progress Notes (Signed)
  Tommi Rumps, MD Phone: [phone]  Kirsten Fletcher is a 47 y.o. female who presents today for same day.  Patient notes for the last several months she has had runny nose.  Notes it was worse about 6 weeks ago.  Notes when she gets up and starts to exercise or moves around at times it will start to drip.  Does not bother her when laying down or if she is not up moving around.  No sneezing.  No congestion.  No postnasal drip.  No ear symptoms.  Rare intermittent chronic mild headaches that are unchanged from prior.  No chest pain.  Does note she runs for exercise and that typically causes the rhinitis symptoms to worsen.  She notes she will feel as if she cannot catch her breath fairly far into her run which is typically a 5K though she will slow down a little bit and pick the pace right back up and be fine.  She occasionally has what sounds to be PVCs.  She will have random sudden palpitations and discomfort that will go away quickly.  She saw cardiology previously and workup was deferred as they do not happen very frequently.  Social History   Tobacco Use  Smoking Status Never Smoker  Smokeless Tobacco Never Used     ROS see history of present illness  Objective  Physical Exam Vitals:   05/03/17 0825  BP: 98/70  Pulse: 70  Temp: 97.7 F (36.5 C)  SpO2: 99%    BP Readings from Last 3 Encounters:  05/03/17 98/70  10/28/16 122/68  08/12/16 100/64   Wt Readings from Last 3 Encounters:  05/03/17 143 lb 9.6 oz (65.1 kg)  10/28/16 139 lb 4 oz (63.2 kg)  08/12/16 140 lb 6.4 oz (63.7 kg)    Physical Exam  Constitutional: No distress.  HENT:  Head: Normocephalic and atraumatic.  Mouth/Throat: Oropharynx is clear and moist.  Nasal mucosa with slight erythema and edema, normal TMs bilaterally  Eyes: Pupils are equal, round, and reactive to light. Conjunctivae are normal.  Cardiovascular: Normal rate, regular rhythm and normal heart sounds.  Pulmonary/Chest: Effort normal and  breath sounds normal.  Musculoskeletal: She exhibits no edema.  Neurological: She is alert.  Skin: Skin is warm and dry. She is not diaphoretic.     Assessment/Plan: Please see individual problem list.  Rhinitis Allergic versus vasomotor.  Erythematous and edematous nasal mucosa.  We will trial Flonase 1 spray each nostril daily.  If not improving after 1-2 weeks she can increase to 2 sprays each nostril and let us know if not improving.  Palpitations Sounds to be PVC or PAC related.  No symptoms.  She is quite active.  She has seen cardiology if these become more frequent or she develops new symptoms we will have her return to see cardiology.  Given return precautions.  No orders of the defined types were placed in this encounter.  Health maintenance: We will request Pap smear and mammogram results from her gynecologist.  Meds ordered this encounter  Medications  . fluticasone (FLONASE) 50 MCG/ACT nasal spray    Sig: Place 1 spray into both nostrils daily.    Dispense:  16 g    Refill:  0     Tommi Rumps, MD [place]

## 2017-05-04 ENCOUNTER — Encounter: Payer: Self-pay | Admitting: Family Medicine

## 2017-06-11 ENCOUNTER — Encounter: Payer: 59 | Admitting: Family Medicine

## 2017-08-10 ENCOUNTER — Other Ambulatory Visit: Payer: Self-pay | Admitting: Family Medicine

## 2017-08-13 DIAGNOSIS — Z01419 Encounter for gynecological examination (general) (routine) without abnormal findings: Secondary | ICD-10-CM | POA: Diagnosis not present

## 2017-08-13 DIAGNOSIS — Z1231 Encounter for screening mammogram for malignant neoplasm of breast: Secondary | ICD-10-CM | POA: Diagnosis not present

## 2017-09-07 ENCOUNTER — Ambulatory Visit (INDEPENDENT_AMBULATORY_CARE_PROVIDER_SITE_OTHER): Payer: 59 | Admitting: Family Medicine

## 2017-09-07 ENCOUNTER — Encounter: Payer: Self-pay | Admitting: Family Medicine

## 2017-09-07 VITALS — BP 100/78 | HR 62 | Temp 97.5°F | Ht 66.0 in | Wt 142.8 lb

## 2017-09-07 DIAGNOSIS — Z130 Encounter for screening for diseases of the blood and blood-forming organs and certain disorders involving the immune mechanism: Secondary | ICD-10-CM

## 2017-09-07 DIAGNOSIS — Z1329 Encounter for screening for other suspected endocrine disorder: Secondary | ICD-10-CM | POA: Diagnosis not present

## 2017-09-07 DIAGNOSIS — Z0000 Encounter for general adult medical examination without abnormal findings: Secondary | ICD-10-CM | POA: Diagnosis not present

## 2017-09-07 DIAGNOSIS — Z13220 Encounter for screening for lipoid disorders: Secondary | ICD-10-CM

## 2017-09-07 LAB — COMPREHENSIVE METABOLIC PANEL
ALT: 12 U/L (ref 0–35)
AST: 17 U/L (ref 0–37)
Albumin: 4.2 g/dL (ref 3.5–5.2)
Alkaline Phosphatase: 33 U/L — ABNORMAL LOW (ref 39–117)
BUN: 12 mg/dL (ref 6–23)
CO2: 28 mEq/L (ref 19–32)
Calcium: 9.7 mg/dL (ref 8.4–10.5)
Chloride: 104 mEq/L (ref 96–112)
Creatinine, Ser: 0.96 mg/dL (ref 0.40–1.20)
GFR: 66.67 mL/min (ref >60.00)
Glucose, Bld: 90 mg/dL (ref 70–99)
Potassium: 4.6 mEq/L (ref 3.5–5.1)
Sodium: 137 mEq/L (ref 135–145)
Total Bilirubin: 0.6 mg/dL (ref 0.2–1.2)
Total Protein: 7.2 g/dL (ref 6.0–8.3)

## 2017-09-07 LAB — CBC
HCT: 43.3 % (ref 36.0–46.0)
Hemoglobin: 14.6 g/dL (ref 12.0–15.0)
MCHC: 33.7 g/dL (ref 30.0–36.0)
MCV: 90.9 fl (ref 78.0–100.0)
Platelets: 202 10*3/uL (ref 150.0–400.0)
RBC: 4.77 Mil/uL (ref 3.87–5.11)
RDW: 12.5 % (ref 11.5–15.5)
WBC: 5.6 10*3/uL (ref 4.0–10.5)

## 2017-09-07 LAB — LIPID PANEL
Cholesterol: 206 mg/dL — ABNORMAL HIGH (ref 0–200)
HDL: 82.5 mg/dL (ref >39.00)
LDL Cholesterol: 108 mg/dL — ABNORMAL HIGH (ref 0–99)
NonHDL: 123.8
Total CHOL/HDL Ratio: 3
Triglycerides: 80 mg/dL (ref 0.0–149.0)
VLDL: 16 mg/dL (ref 0.0–40.0)

## 2017-09-07 LAB — TSH: TSH: 1.05 u[IU]/mL (ref 0.35–4.50)

## 2017-09-07 NOTE — Patient Instructions (Signed)
Nice to see you. Please try to monitor your diet.  Please try to eat plenty of vegetables. Please continue to exercise. If you would like to get the flu shot here please let us know.

## 2017-09-07 NOTE — Progress Notes (Signed)
Tommi Rumps, MD Phone: [phone]  Kirsten Fletcher is a 47 y.o. female who presents today for cpe.  Exercises 6 days a week by going to boot camp. Tries to watch her sugar intake.  Not much of a meat eater.  She drinks shakes for protein and eats nuts.  Tries to get plenty of vegetables. Reports Pap smear and mammogram done on August 2 at [facility].  She reports these were negative.  We will request records.  Notes menses once monthly lasting for 1 to 2 days. Defers HIV screening though she does have children and likely had screening previously. No family history of breast cancer, ovarian cancer, or colon cancer. No tobacco use or illicit drug use.  One alcoholic beverage weekly. Sees a dentist twice yearly.  Ophthalmologist once yearly.  Active Ambulatory Problems    Diagnosis Date Noted  . Shellfish allergy 05/01/2015  . Routine general medical examination at a health care facility 05/31/2015  . Palpitations 08/12/2016  . Malaise 10/28/2016  . Rhinitis 05/03/2017   Resolved Ambulatory Problems    Diagnosis Date Noted  . No Resolved Ambulatory Problems   Past Medical History:  Diagnosis Date  . Hyperemesis gravidarum     Family History  Problem Relation Age of Onset  . Diabetes Mother   . Diabetes Father   . Diabetes Maternal Grandmother   . Diabetes Maternal Grandfather   . Diabetes Paternal Grandmother   . Diabetes Paternal Grandfather   . Diabetes Brother   . Heart disease Maternal Aunt   . Colon cancer Neg Hx     Social History   Socioeconomic History  . Marital status: Married    Spouse name: Not on file  . Number of children: 2  . Years of education: Not on file  . Highest education level: Not on file  Occupational History  . Occupation: real Conservation officer, historic buildings  Social Needs  . Financial resource strain: Not on file  . Food insecurity:    Worry: Not on file    Inability: Not on file  . Transportation needs:    Medical: Not on file   Non-medical: Not on file  Tobacco Use  . Smoking status: Never Smoker  . Smokeless tobacco: Never Used  Substance and Sexual Activity  . Alcohol use: Not on file  . Drug use: No  . Sexual activity: Not on file  Lifestyle  . Physical activity:    Days per week: Not on file    Minutes per session: Not on file  . Stress: Not on file  Relationships  . Social connections:    Talks on phone: Not on file    Gets together: Not on file    Attends religious service: Not on file    Active member of club or organization: Not on file    Attends meetings of clubs or organizations: Not on file    Relationship status: Not on file  . Intimate partner violence:    Fear of current or ex partner: Not on file    Emotionally abused: Not on file    Physically abused: Not on file    Forced sexual activity: Not on file  Other Topics Concern  . 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 CV:  Negative for chest pain, dyspnea, edema, palpitations Resp: Negative for cough, dyspnea, hemoptysis GI: Negative for nausea, vomiting, diarrhea, constipation, abdominal pain, melena, hematochezia. GU: Negative for dysuria, incontinence, urinary hesitance, hematuria, vaginal or penile discharge, polyuria, sexual difficulty, lumps in testicle or breasts MSK: Negative for muscle cramps or aches, joint pain or swelling Neuro: Negative for headaches, weakness, numbness, dizziness, passing out/fainting Psych: Negative for depression, anxiety, memory problems  Objective  Physical Exam Vitals:   09/07/17 0924  BP: 100/78  Pulse: 62  Temp: (!) 97.5 F (36.4 C)  SpO2: 96%    BP Readings from  Last 3 Encounters:  09/07/17 100/78  05/03/17 98/70  10/28/16 122/68   Wt Readings from Last 3 Encounters:  09/07/17 142 lb 12.8 oz (64.8 kg)  05/03/17 143 lb 9.6 oz (65.1 kg)  10/28/16 139 lb 4 oz (63.2 kg)    Physical Exam  Constitutional: No distress.  HENT:  Head: Normocephalic and atraumatic.  Mouth/Throat: Oropharynx is clear and moist.  Eyes: Pupils are equal, round, and reactive to light. Conjunctivae are normal.  Neck: Neck supple.  Cardiovascular: Normal rate, regular rhythm and normal heart sounds.  Pulmonary/Chest: Effort normal and breath sounds normal.  Abdominal: Soft. Bowel sounds are normal. She exhibits no distension. There is no tenderness.  Musculoskeletal: She exhibits no edema.  Lymphadenopathy:    She has no cervical adenopathy.  Neurological: She is alert.  Skin: Skin is warm and dry. She is not diaphoretic.  Psychiatric: She has a normal mood and affect.     Assessment/Plan:   Routine general medical examination at a health care facility Physical exam completed.  Overall the patient is doing quite well.  Encouraged continued diet and exercise.  Encouraged adequate protein intake.  We will request Pap smear and mammogram records from her gynecologist.  HIV screening deferred.  Lab work as outlined below.  Encouraged to get a flu shot when they become available.   Orders Placed This Encounter  Procedures  . Lipid panel  . Comp Met (CMET)  . TSH  . CBC    No orders of the defined types were placed in this encounter.    Tommi Rumps, MD [place]

## 2017-09-07 NOTE — Assessment & Plan Note (Addendum)
Physical exam completed.  Overall the patient is doing quite well.  Encouraged continued diet and exercise.  Encouraged adequate protein intake.  We will request Pap smear and mammogram records from her gynecologist.  HIV screening deferred.  Lab work as outlined below.  Encouraged to get a flu shot when they become available.

## 2018-09-12 ENCOUNTER — Ambulatory Visit (INDEPENDENT_AMBULATORY_CARE_PROVIDER_SITE_OTHER): Payer: BLUE CROSS/BLUE SHIELD | Admitting: Family Medicine

## 2018-09-12 ENCOUNTER — Other Ambulatory Visit: Payer: Self-pay

## 2018-09-12 ENCOUNTER — Encounter: Payer: Self-pay | Admitting: Family Medicine

## 2018-09-12 VITALS — BP 110/60 | HR 78 | Temp 98.1°F | Ht 66.0 in | Wt 146.4 lb

## 2018-09-12 DIAGNOSIS — Z1329 Encounter for screening for other suspected endocrine disorder: Secondary | ICD-10-CM

## 2018-09-12 DIAGNOSIS — Z0001 Encounter for general adult medical examination with abnormal findings: Secondary | ICD-10-CM | POA: Diagnosis not present

## 2018-09-12 DIAGNOSIS — Z130 Encounter for screening for diseases of the blood and blood-forming organs and certain disorders involving the immune mechanism: Secondary | ICD-10-CM

## 2018-09-12 DIAGNOSIS — Z13220 Encounter for screening for lipoid disorders: Secondary | ICD-10-CM

## 2018-09-12 DIAGNOSIS — K5900 Constipation, unspecified: Secondary | ICD-10-CM | POA: Insufficient documentation

## 2018-09-12 DIAGNOSIS — Z0000 Encounter for general adult medical examination without abnormal findings: Secondary | ICD-10-CM

## 2018-09-12 LAB — COMPREHENSIVE METABOLIC PANEL
ALT: 17 U/L (ref 0–35)
AST: 20 U/L (ref 0–37)
Albumin: 4 g/dL (ref 3.5–5.2)
Alkaline Phosphatase: 37 U/L — ABNORMAL LOW (ref 39–117)
BUN: 7 mg/dL (ref 6–23)
CO2: 27 mEq/L (ref 19–32)
Calcium: 8.9 mg/dL (ref 8.4–10.5)
Chloride: 106 mEq/L (ref 96–112)
Creatinine, Ser: 0.8 mg/dL (ref 0.40–1.20)
GFR: 77.07 mL/min (ref >60.00)
Glucose, Bld: 76 mg/dL (ref 70–99)
Potassium: 3.7 mEq/L (ref 3.5–5.1)
Sodium: 141 mEq/L (ref 135–145)
Total Bilirubin: 0.7 mg/dL (ref 0.2–1.2)
Total Protein: 6.2 g/dL (ref 6.0–8.3)

## 2018-09-12 LAB — CBC
HCT: 40.4 % (ref 36.0–46.0)
Hemoglobin: 13.5 g/dL (ref 12.0–15.0)
MCHC: 33.5 g/dL (ref 30.0–36.0)
MCV: 90.8 fl (ref 78.0–100.0)
Platelets: 171 10*3/uL (ref 150.0–400.0)
RBC: 4.45 Mil/uL (ref 3.87–5.11)
RDW: 12.9 % (ref 11.5–15.5)
WBC: 5.5 10*3/uL (ref 4.0–10.5)

## 2018-09-12 LAB — LIPID PANEL
Cholesterol: 209 mg/dL — ABNORMAL HIGH (ref 0–200)
HDL: 76.1 mg/dL (ref >39.00)
LDL Cholesterol: 116 mg/dL — ABNORMAL HIGH (ref 0–99)
NonHDL: 133.32
Total CHOL/HDL Ratio: 3
Triglycerides: 85 mg/dL (ref 0.0–149.0)
VLDL: 17 mg/dL (ref 0.0–40.0)

## 2018-09-12 LAB — TSH: TSH: 0.91 u[IU]/mL (ref 0.35–4.50)

## 2018-09-12 MED ORDER — EPINEPHRINE 0.3 MG/0.3ML IJ SOAJ: 0.3000 mg | Freq: Once | INTRAMUSCULAR | 0 refills | Status: AC

## 2018-09-12 NOTE — Progress Notes (Signed)
Tommi Rumps, MD Phone: [phone]  Kirsten Fletcher is a 48 y.o. female who presents today for CPE.  Exercise: She runs and goes to the gym 6 days a week. Diet: She not eating any sugar.  She does drink protein shakes.  Not eating very much meat.  No junk food. Mammogram was completed at [facility] care.  Pap smear was negative for intraepithelial lesions on 08/13/2017.  She follows with gynecology for this.  She has menstrual cycles once monthly.  Menstrual cycles last 3 to 4 days on her OCP though she is no longer on that. No family history of breast cancer or colon cancer or ovarian cancer. Had prior HIV screening with her children. She declines flu vaccine.  Tetanus vaccine up-to-date. No tobacco use or illicit drug use.  She has 1 glass of wine weekly. She sees a Pharmacist, community and an ophthalmologist. She does report some sadness and grief from her father passing away though no depression.  She does have support at home. Constipation: She notes she has never been regular.  She will take over-the-counter MiraLAX or fiber Gummies to help with this.  If she takes MiraLAX 2 days in a row she ends up with diarrhea.  She will go 3 to 4 days without a bowel movement and have to strain if she does not take anything.  She does not consistently take the fiber.  Active Ambulatory Problems    Diagnosis Date Noted  . Shellfish allergy 05/01/2015  . Routine general medical examination at a health care facility 05/31/2015  . Palpitations 08/12/2016  . Malaise 10/28/2016  . Rhinitis 05/03/2017  . Constipation 09/12/2018   Resolved Ambulatory Problems    Diagnosis Date Noted  . No Resolved Ambulatory Problems   Past Medical History:  Diagnosis Date  . Hyperemesis gravidarum     Family History  Problem Relation Age of Onset  . Diabetes Mother   . Diabetes Father   . Diabetes Maternal Grandmother   . Diabetes Maternal Grandfather   . Diabetes Paternal Grandmother   . Diabetes Paternal  Grandfather   . Diabetes Brother   . Heart disease Maternal Aunt   . Colon cancer Neg Hx     Social History   Socioeconomic History  . Marital status: Married    Spouse name: Not on file  . Number of children: 2  . Years of education: Not on file  . Highest education level: Not on file  Occupational History  . Occupation: real Conservation officer, historic buildings  Social Needs  . Financial resource strain: Not on file  . Food insecurity    Worry: Not on file    Inability: Not on file  . Transportation needs    Medical: Not on file    Non-medical: Not on file  Tobacco Use  . Smoking status: Never Smoker  . Smokeless tobacco: Never Used  Substance and Sexual Activity  . Alcohol use: Not on file  . Drug use: No  . Sexual activity: Not on file  Lifestyle  . Physical activity    Days per week: Not on file    Minutes per session: Not on file  . Stress: Not on file  Relationships  . Social Herbalist on phone: Not on file    Gets together: Not on file    Attends religious service: Not on file    Active member of club or organization: Not on file    Attends meetings of clubs or organizations:  Not on file    Relationship status: Not on file  . Intimate partner violence    Fear of current or ex partner: Not on file    Emotionally abused: Not on file    Physically abused: Not on file    Forced sexual activity: Not on file  Other Topics Concern  . 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 CV:  Negative for chest pain, dyspnea, edema, palpitations Resp: Negative for cough, dyspnea, hemoptysis GI: Positive for constipation, diarrhea negative for nausea, vomiting,  abdominal pain, melena, hematochezia. GU: Negative for dysuria, incontinence, urinary hesitance, hematuria, vaginal or penile discharge, polyuria, sexual difficulty, lumps in testicle or breasts MSK: Negative for muscle cramps or aches, joint pain or swelling Neuro: Negative for headaches, weakness, numbness, dizziness, passing out/fainting Psych: Positive for sadness/grief, negative for depression, anxiety, memory problems  Objective  Physical Exam Vitals:   09/12/18 0816  BP: 110/60  Pulse: 78  Temp: 98.1 F (36.7 C)  SpO2: 99%    BP Readings from Last 3 Encounters:  09/12/18 110/60  09/07/17 100/78  05/03/17 98/70   Wt Readings from Last 3 Encounters:  09/12/18 146 lb 6.4 oz (66.4 kg)  09/07/17 142 lb 12.8 oz (64.8 kg)  05/03/17 143 lb 9.6 oz (65.1 kg)    Physical Exam Constitutional:      General: She is not in acute distress.    Appearance: She is not diaphoretic.  HENT:     Head: Normocephalic and atraumatic.  Eyes:     Conjunctiva/sclera: Conjunctivae normal.     Pupils: Pupils are equal, round, and reactive to light.  Cardiovascular:     Rate and Rhythm: Normal rate and regular rhythm.     Heart sounds: Normal heart sounds.  Pulmonary:     Effort: Pulmonary effort is normal.     Breath sounds: Normal breath sounds.  Abdominal:     General: Bowel sounds are normal. There is no distension.     Palpations: Abdomen is soft.     Tenderness: There is no abdominal tenderness. There is no guarding or rebound.  Musculoskeletal:     Right lower leg: No edema.     Left lower leg: No edema.  Lymphadenopathy:     Cervical: No cervical adenopathy.  Skin:    General: Skin is warm and dry.  Neurological:     Mental Status: She is alert.  Psychiatric:        Mood and Affect: Mood normal.      Assessment/Plan:   Routine general medical examination at a health care facility Physical exam completed.  She will continue healthy diet and exercise.  We will request  records from gynecology.  Lab work as outlined below.  Offered support regarding her dad and if this progresses at all she will let us know.  Constipation Chronic issue.  Advised on daily fiber supplement and if not improving she will let us know.   Orders Placed This Encounter  Procedures  . Comp Met (CMET)  . Lipid panel  . TSH  . CBC    Meds ordered this encounter  Medications  . EPINEPHrine 0.3 mg/0.3 mL IJ SOAJ  injection    Sig: Inject 0.3 mLs (0.3 mg total) into the muscle once for 1 dose.    Dispense:  0.3 mL    Refill:  0     Tommi Rumps, MD [place]

## 2018-09-12 NOTE — Patient Instructions (Signed)
Nice to see you. We will get labs today.  Please try a daily fiber supplement for your constipation. If it is not improving over the next couple of weeks please let us know.

## 2018-09-12 NOTE — Assessment & Plan Note (Signed)
Chronic issue.  Advised on daily fiber supplement and if not improving she will let us know.

## 2018-09-12 NOTE — Assessment & Plan Note (Signed)
Physical exam completed.  She will continue healthy diet and exercise.  We will request records from gynecology.  Lab work as outlined below.  Offered support regarding her dad and if this progresses at all she will let us know.

## 2019-03-20 ENCOUNTER — Ambulatory Visit: Payer: BLUE CROSS/BLUE SHIELD | Admitting: Family Medicine

## 2019-03-20 ENCOUNTER — Other Ambulatory Visit: Payer: Self-pay

## 2019-03-24 ENCOUNTER — Telehealth (INDEPENDENT_AMBULATORY_CARE_PROVIDER_SITE_OTHER): Payer: BC Managed Care – PPO | Admitting: Family Medicine

## 2019-03-24 ENCOUNTER — Other Ambulatory Visit: Payer: Self-pay

## 2019-03-24 ENCOUNTER — Encounter: Payer: Self-pay | Admitting: Family Medicine

## 2019-03-24 DIAGNOSIS — R0600 Dyspnea, unspecified: Secondary | ICD-10-CM | POA: Diagnosis not present

## 2019-03-24 DIAGNOSIS — R0609 Other forms of dyspnea: Secondary | ICD-10-CM

## 2019-03-24 NOTE — Progress Notes (Signed)
Virtual Visit via video Note  This visit type was conducted due to national recommendations for restrictions regarding the COVID-19 pandemic (e.g. social distancing).  This format is felt to be most appropriate for this patient at this time.  All issues noted in this document were discussed and addressed.  No physical exam was performed (except for noted visual exam findings with Video Visits).   I connected with Kirsten Fletcher today at  4:00 PM EST by a video enabled telemedicine application or telephone and verified that I am speaking with the correct person using two identifiers. Location patient: home Location provider: home office Persons participating in the virtual visit: patient, provider  I discussed the limitations, risks, security and privacy concerns of performing an evaluation and management service by telephone and the availability of in person appointments. I also discussed with the patient that there may be a patient responsible charge related to this service. The patient expressed understanding and agreed to proceed.  Reason for visit: Follow-up.  HPI: Dyspnea on exertion: Patient notes she had COVID-19 in December and January.  She woke up on December 29 and could not smell or taste anything.  She did not really have any other symptoms.  She recovered from that though has noticed when she has been exercising and running and also doing simple tasks such as getting groceries out of the car she has had some dyspnea that she feels is excessive.  No chest pain.  No cough.  No wheezing.  No fevers.  She had lab work in January that she is going to get to [place] to review.   ROS: See pertinent positives and negatives per HPI.  Past Medical History:  Diagnosis Date  . Hyperemesis gravidarum     No past surgical history on file.  Family History  Problem Relation Age of Onset  . Diabetes Mother   . Diabetes Father   . Diabetes Maternal Grandmother   . Diabetes Maternal Grandfather    . Diabetes Paternal Grandmother   . Diabetes Paternal Grandfather   . Diabetes Brother   . Heart disease Maternal Aunt   . Colon cancer Neg Hx     SOCIAL HX: Non-smoker   Current Outpatient Medications:  .  drospirenone-ethinyl estradiol (YAZ,GIANVI,LORYNA) 3-0.02 MG tablet, Take 1 tablet by mouth daily., Disp: , Rfl:  .  fluticasone (FLONASE) 50 MCG/ACT nasal spray, SHAKE LIQUID AND USE 1 SPRAY IN EACH NOSTRIL DAILY, Disp: 16 g, Rfl: 0  EXAM:  VITALS per patient if applicable:  GENERAL: alert, oriented, appears well and in no acute distress  HEENT: atraumatic, conjunttiva clear, no obvious abnormalities on inspection of external nose and ears  NECK: normal movements of the head and neck  LUNGS: on inspection no signs of respiratory distress, breathing rate appears normal, no obvious gross SOB, gasping or wheezing  CV: no obvious cyanosis  MS: moves all visible extremities without noticeable abnormality   ASSESSMENT AND PLAN:  Discussed the following assessment and plan:  Dyspnea on exertion Started after having Covid.  It could be related to that.  She will send [place] her lab work to review for other potential causes.  We will get a chest x-ray.  Once we have those results we will determine if she needs cardiac evaluation or further pulmonary evaluation.   Orders Placed This Encounter  Procedures  . DG Chest 2 View    Standing Status:   Future    Standing Expiration Date:   05/25/2020  Order Specific Question:   Reason for Exam (SYMPTOM  OR DIAGNOSIS REQUIRED)    Answer:   Persistent dyspnea on exertion after COVID-19 in December 2020    Order Specific Question:   Is patient pregnant?    Answer:   No    Order Specific Question:   Preferred imaging location?    Answer:   [facility]    Order Specific Question:   Radiology Contrast Protocol - do NOT remove file path    Answer:   \\[email or web address]    No orders of the  defined types were placed in this encounter.    I discussed the assessment and treatment plan with the patient. The patient was provided an opportunity to ask questions and all were answered. The patient agreed with the plan and demonstrated an understanding of the instructions.   The patient was advised to call back or seek an in-person evaluation if the symptoms worsen or if the condition fails to improve as anticipated.   Tommi Rumps, MD

## 2019-03-26 DIAGNOSIS — R0609 Other forms of dyspnea: Secondary | ICD-10-CM | POA: Insufficient documentation

## 2019-03-26 NOTE — Assessment & Plan Note (Signed)
Started after having Covid.  It could be related to that.  She will send [place] her lab work to review for other potential causes.  We will get a chest x-ray.  Once we have those results we will determine if she needs cardiac evaluation or further pulmonary evaluation.

## 2019-03-28 NOTE — Progress Notes (Signed)
I called and left a VM for the patient to go to the medical mall at [facility].  Nina,cma

## 2019-03-29 ENCOUNTER — Ambulatory Visit
Admission: RE | Admit: 2019-03-29 | Discharge: 2019-03-29 | Disposition: A | Discharge status: Home / Self Care | Payer: BC Managed Care – PPO | Source: Ambulatory Visit | Attending: Family Medicine | Admitting: Family Medicine

## 2019-03-29 DIAGNOSIS — R0609 Other forms of dyspnea: Secondary | ICD-10-CM | POA: Diagnosis not present

## 2019-03-29 DIAGNOSIS — R0600 Dyspnea, unspecified: Secondary | ICD-10-CM | POA: Insufficient documentation

## 2019-04-04 NOTE — Progress Notes (Signed)
I called and left a VM to reminding the patient to go to [facility] for her chest xray.  Zahrah Sutherlin,cma

## 2019-04-28 ENCOUNTER — Other Ambulatory Visit: Payer: Self-pay

## 2019-04-28 ENCOUNTER — Ambulatory Visit: Payer: BC Managed Care – PPO | Admitting: Family Medicine

## 2019-04-28 ENCOUNTER — Encounter: Payer: Self-pay | Admitting: Family Medicine

## 2019-04-28 VITALS — BP 110/70 | HR 79 | Temp 96.6°F | Ht 66.0 in | Wt 149.8 lb

## 2019-04-28 DIAGNOSIS — R0600 Dyspnea, unspecified: Secondary | ICD-10-CM | POA: Diagnosis not present

## 2019-04-28 DIAGNOSIS — R0609 Other forms of dyspnea: Secondary | ICD-10-CM

## 2019-04-28 NOTE — Assessment & Plan Note (Signed)
Some improvement since we last spoke.  EKG is relatively reassuring though there are some borderline ST elevations and thus I would like to refer her to cardiology for evaluation.  Will obtain lab work as outlined below as well.

## 2019-04-28 NOTE — Patient Instructions (Signed)
Nice to see you. We will get labs today and contact you with the results. We will get you to see cardiology as well.

## 2019-04-28 NOTE — Progress Notes (Signed)
  Tommi Rumps, MD Phone: [phone]  Kirsten Fletcher is a 49 y.o. female who presents today for f/u.  DOE: continues to have dyspnea on exertion with simple tasks as well as exercise that she feels is excessive. No cough, CP, or wheezing. Started after COVID19 diagnosis.   Social History   Tobacco Use  Smoking Status Never Smoker  Smokeless Tobacco Never Used     ROS see history of present illness  Objective  Physical Exam Vitals:   04/28/19 1526  BP: 110/70  Pulse: 79  Temp: (!) 96.6 F (35.9 C)  SpO2: 99%    BP Readings from Last 3 Encounters:  04/28/19 110/70  09/12/18 110/60  09/07/17 100/78   Wt Readings from Last 3 Encounters:  04/28/19 149 lb 12.8 oz (67.9 kg)  03/24/19 140 lb (63.5 kg)  09/12/18 146 lb 6.4 oz (66.4 kg)    Physical Exam Constitutional:      General: She is not in acute distress.    Appearance: She is not diaphoretic.  Cardiovascular:     Rate and Rhythm: Normal rate and regular rhythm.     Heart sounds: Normal heart sounds.  Pulmonary:     Effort: Pulmonary effort is normal.     Breath sounds: Normal breath sounds.  Musculoskeletal:     Right lower leg: No edema.     Left lower leg: No edema.  Skin:    General: Skin is warm and dry.  Neurological:     Mental Status: She is alert.    EKG: Normal sinus rhythm, rate 73, borderline ST elevation likely related to early repolarization, this does appear different than her prior EKG  Assessment/Plan: Please see individual problem list.  Dyspnea on exertion Some improvement since we last spoke.  EKG is relatively reassuring though there are some borderline ST elevations and thus I would like to refer her to cardiology for evaluation.  Will obtain lab work as outlined below as well.   Orders Placed This Encounter  Procedures  . CBC  . TSH  . Comp Met (CMET)  . Ambulatory referral to Cardiology    Referral Priority:   Routine    Referral Type:   Consultation    Referral  Reason:   Specialty Services Required    Requested Specialty:   Cardiology    Number of Visits Requested:   1  . EKG 12-Lead    No orders of the defined types were placed in this encounter.   This visit occurred during the SARS-CoV-2 public health emergency.  Safety protocols were in place, including screening questions prior to the visit, additional usage of staff PPE, and extensive cleaning of exam room while observing appropriate contact time as indicated for disinfecting solutions.    Tommi Rumps, MD [place]

## 2019-04-29 LAB — CBC
HCT: 35.9 % (ref 35.0–45.0)
Hemoglobin: 11.8 g/dL (ref 11.7–15.5)
MCH: 27.9 pg (ref 27.0–33.0)
MCHC: 32.9 g/dL (ref 32.0–36.0)
MCV: 84.9 fL (ref 80.0–100.0)
MPV: 11.5 fL (ref 7.5–12.5)
Platelets: 260 10*3/uL (ref 140–400)
RBC: 4.23 10*6/uL (ref 3.80–5.10)
RDW: 12 % (ref 11.0–15.0)
WBC: 8.7 10*3/uL (ref 3.8–10.8)

## 2019-04-29 LAB — COMPREHENSIVE METABOLIC PANEL
AG Ratio: 1.7 (calc) (ref 1.0–2.5)
ALT: 9 U/L (ref 6–29)
AST: 17 U/L (ref 10–35)
Albumin: 4.2 g/dL (ref 3.6–5.1)
Alkaline phosphatase (APISO): 29 U/L — ABNORMAL LOW (ref 31–125)
BUN: 20 mg/dL (ref 7–25)
CO2: 24 mmol/L (ref 20–32)
Calcium: 9.5 mg/dL (ref 8.6–10.2)
Chloride: 103 mmol/L (ref 98–110)
Creat: 0.92 mg/dL (ref 0.50–1.10)
Globulin: 2.5 g/dL (calc) (ref 1.9–3.7)
Glucose, Bld: 88 mg/dL (ref 65–99)
Potassium: 4.2 mmol/L (ref 3.5–5.3)
Sodium: 136 mmol/L (ref 135–146)
Total Bilirubin: 0.4 mg/dL (ref 0.2–1.2)
Total Protein: 6.7 g/dL (ref 6.1–8.1)

## 2019-04-29 LAB — TSH: TSH: 0.96 mIU/L

## 2019-05-12 ENCOUNTER — Ambulatory Visit: Payer: BC Managed Care – PPO | Admitting: Internal Medicine

## 2019-05-31 ENCOUNTER — Encounter: Payer: Self-pay | Admitting: Internal Medicine

## 2019-05-31 ENCOUNTER — Other Ambulatory Visit: Payer: Self-pay

## 2019-05-31 ENCOUNTER — Ambulatory Visit: Payer: BC Managed Care – PPO | Admitting: Internal Medicine

## 2019-05-31 VITALS — BP 110/64 | HR 70 | Ht 66.0 in | Wt 142.0 lb

## 2019-05-31 DIAGNOSIS — R0600 Dyspnea, unspecified: Secondary | ICD-10-CM

## 2019-05-31 DIAGNOSIS — E785 Hyperlipidemia, unspecified: Secondary | ICD-10-CM | POA: Diagnosis not present

## 2019-05-31 DIAGNOSIS — Z8616 Personal history of COVID-19: Secondary | ICD-10-CM | POA: Diagnosis not present

## 2019-05-31 DIAGNOSIS — R0609 Other forms of dyspnea: Secondary | ICD-10-CM

## 2019-05-31 NOTE — Patient Instructions (Signed)
Medication Instructions:  NO CHANGES *If you need a refill on your cardiac medications before your next appointment, please call your pharmacy*   Lab Work: NOT NEEDED   Testing/Procedures: WILL BE SCHEDULE AT [place] [address] Your physician has requested that you have an echocardiogram. Echocardiography is a painless test that uses sound waves to create images of your heart. It provides your doctor with information about the size and shape of your heart and how well your heart's chambers and valves are working. This procedure takes approximately one hour. There are no restrictions for this procedure.     Follow-Up: At [facility], you and your health needs are our priority.  As part of our continuing mission to provide you with exceptional heart care, we have created designated Provider Care Teams.  These Care Teams include your primary Cardiologist (physician) and Advanced Practice Providers (APPs -  Physician Assistants and Nurse Practitioners) who all work together to provide you with the care you need, when you need it.    Your next appointment:     AS NEEDED  The format for your next appointment:   Either In Person or Virtual  Provider:   You may see Dr Margaretann Loveless or one of the following Advanced Practice Providers on your designated Care Team:    Rosaria Ferries, PA-C  Jory Sims, DNP, ANP  Cadence Kathlen Mody, NP

## 2019-05-31 NOTE — Progress Notes (Signed)
Cardiology Office Note:    Date:  05/31/2019   ID:  Kirsten Fletcher, DOB Oct 23, 1970, MRN [id number]  PCP:  Leone Haven, MD  Cardiologist:  No primary care provider on file.  Electrophysiologist:  None   Referring MD: Leone Haven, MD   Chief Complaint: DOE after covid  History of Present Illness:    Kirsten Fletcher is a 49 y.o. female with no significant past medical history who presents for evaluation of DOE after COVID 19 infection in December 2020.   She notes being inactive during her illness for about 2 weeks but did not require hospitalization or medical therapy.  She notes that since that time she has been short of breath with exertion.  She exercises doing HIIT and running, and notes that she is more short of breath than she was pre-Covid.  She does feel it is improving.  She denies a sense of chest pain or palpitations with activity.  She notes that even getting her groceries out of the car she will be gasping for air.  She has no significant past medical history or surgical history.  No family history of coronary artery disease or sudden cardiac death.  Strong family history of diabetes from chart review.  She has previously been seen by my partner Dr. Harrell Gave End.  Past Medical History:  Diagnosis Date  . Hyperemesis gravidarum     No past surgical history on file.  Current Medications: Current Meds  Medication Sig  . drospirenone-ethinyl estradiol (YAZ,GIANVI,LORYNA) 3-0.02 MG tablet Take 1 tablet by mouth daily.     Allergies:   Shellfish allergy and Latex   Social History   Socioeconomic History  . Marital status: Married    Spouse name: Not on file  . Number of children: 2  . Years of education: Not on file  . Highest education level: Not on file  Occupational History  . Occupation: real Conservation officer, historic buildings  Tobacco Use  . Smoking status: Never Smoker  . Smokeless tobacco: Never Used  Substance and Sexual Activity  . Alcohol use: Not on file    . Drug use: No  . Sexual activity: Not on file  Other Topics Concern  . 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 Difficulty of Paying Living Expenses:   Food Insecurity:   . Worried About Charity fundraiser in the Last Year:   . Arboriculturist in the Last Year:   Transportation Needs:   . Film/video editor (Medical):   Marland Kitchen Lack of Transportation (Non-Medical):   Physical Activity:   . Days of Exercise per Week:   . Minutes of Exercise per Session:   Stress:   . Feeling of Stress :   Social Connections:   . Frequency of Communication with Friends and Family:   . Frequency of Social Gatherings with Friends and Family:   . Attends Religious Services:   . Active Member of Clubs or Organizations:   . Attends Archivist Meetings:   Marland Kitchen Marital Status:      Family History: The patient's family history includes Diabetes in her brother, father, maternal grandfather, maternal grandmother, mother, paternal grandfather, and paternal grandmother; Heart disease in her maternal aunt. There is no history of Colon cancer.  ROS:   Please see the history of present  illness.    All other systems reviewed and are negative.  EKGs/Labs/Other Studies Reviewed:    The following studies were reviewed today:  EKG:  NSR  I have independently reviewed the images from Chest Xray 03/29/19, no acute findings.  Recent Labs: 04/28/2019: ALT 9; BUN 20; Creat 0.92; Hemoglobin 11.8; Platelets 260; Potassium 4.2; Sodium 136; TSH 0.96  Recent Lipid Panel    Component Value Date/Time   CHOL 209 (H) 09/12/2018 0842   TRIG 85.0 09/12/2018 0842   HDL 76.10 09/12/2018 0842   CHOLHDL 3 09/12/2018 0842   VLDL 17.0 09/12/2018 0842   LDLCALC 116 (H) 09/12/2018 0842    Physical Exam:    VS:  BP 110/64   Pulse 70   Ht 5\' 6"  (1.676 m)   Wt 142 lb (64.4  kg)   BMI 22.92 kg/m     Wt Readings from Last 5 Encounters:  05/31/19 142 lb (64.4 kg)  04/28/19 149 lb 12.8 oz (67.9 kg)  03/24/19 140 lb (63.5 kg)  09/12/18 146 lb 6.4 oz (66.4 kg)  09/07/17 142 lb 12.8 oz (64.8 kg)     Constitutional: No acute distress Eyes: sclera non-icteric, normal conjunctiva and lids ENMT: normal dentition, moist mucous membranes Cardiovascular: regular rhythm, normal rate, no murmurs. S1 and S2 normal. Radial pulses normal bilaterally. No jugular venous distention.  Respiratory: clear to auscultation bilaterally GI : normal bowel sounds, soft and nontender. No distention.   MSK: extremities warm, well perfused. No edema.  NEURO: grossly nonfocal exam, moves all extremities. PSYCH: alert and oriented x 3, normal mood and affect.   ASSESSMENT:    1. Dyspnea on exertion   2. History of COVID-19   3. Hyperlipidemia, unspecified hyperlipidemia type    PLAN:    Dyspnea on exertion  History of COVID-19 - Plan: EKG 12-Lead, ECHOCARDIOGRAM COMPLETE  She feels she is getting better, but has protracted dyspnea with minimal activity.  Recommend echocardiogram to evaluate structure and function, as well as an assessment of the pericardium after COVID-19 infection.  Hyperlipidemia-recommend continued diet lifestyle modification, LDL 116.  Goal LDL is less than 70.  Total time of encounter: 30 minutes total time of encounter, including 25 minutes spent in face-to-face patient care on the date of this encounter. This time includes coordination of care and counseling regarding above mentioned problem list. Remainder of non-face-to-face time involved reviewing chart documents/testing relevant to the patient encounter and documentation in the medical record. I have independently reviewed documentation from referring provider.   Cherlynn Kaiser, MD [place]  [facility]    Medication Adjustments/Labs and Tests Ordered: Current medicines are reviewed at  length with the patient today.  Concerns regarding medicines are outlined above.  Orders Placed This Encounter  Procedures  . EKG 12-Lead  . ECHOCARDIOGRAM COMPLETE   No orders of the defined types were placed in this encounter.   Patient Instructions  Medication Instructions:  NO CHANGES *If you need a refill on your cardiac medications before your next appointment, please call your pharmacy*   Lab Work: NOT NEEDED   Testing/Procedures: WILL BE SCHEDULE AT [place] [address] Your physician has requested that you have an echocardiogram. Echocardiography is a painless test that uses sound waves to create images of your heart. It provides your doctor with information about the size and shape of your heart and how well your heart's chambers and valves are working. This procedure takes approximately one hour. There are no restrictions for  this procedure.     Follow-Up: At [facility], you and your health needs are our priority.  As part of our continuing mission to provide you with exceptional heart care, we have created designated Provider Care Teams.  These Care Teams include your primary Cardiologist (physician) and Advanced Practice Providers (APPs -  Physician Assistants and Nurse Practitioners) who all work together to provide you with the care you need, when you need it.    Your next appointment:     AS NEEDED  The format for your next appointment:   Either In Person or Virtual  Provider:   You may see Dr Margaretann Loveless or one of the following Advanced Practice Providers on your designated Care Team:    Rosaria Ferries, PA-C  Jory Sims, DNP, ANP  Cadence Kathlen Mody, NP

## 2019-07-03 ENCOUNTER — Telehealth (HOSPITAL_COMMUNITY): Payer: Self-pay | Admitting: Internal Medicine

## 2019-07-03 ENCOUNTER — Other Ambulatory Visit (HOSPITAL_COMMUNITY): Payer: BC Managed Care – PPO

## 2019-07-03 NOTE — Telephone Encounter (Signed)
I called patient and left a voicemail to call the office regarding rescheduling echocardiogram.

## 2019-07-05 ENCOUNTER — Other Ambulatory Visit: Payer: Self-pay

## 2019-07-05 ENCOUNTER — Ambulatory Visit (HOSPITAL_COMMUNITY): Payer: BC Managed Care – PPO | Attending: Internal Medicine

## 2019-07-05 DIAGNOSIS — R0600 Dyspnea, unspecified: Secondary | ICD-10-CM | POA: Diagnosis not present

## 2019-07-05 DIAGNOSIS — Z8616 Personal history of COVID-19: Secondary | ICD-10-CM | POA: Insufficient documentation

## 2019-07-05 DIAGNOSIS — R0609 Other forms of dyspnea: Secondary | ICD-10-CM

## 2019-07-28 ENCOUNTER — Other Ambulatory Visit: Payer: Self-pay

## 2019-07-31 ENCOUNTER — Ambulatory Visit: Payer: BC Managed Care – PPO | Admitting: Family Medicine

## 2019-07-31 DIAGNOSIS — Z0289 Encounter for other administrative examinations: Secondary | ICD-10-CM

## 2019-08-18 LAB — HM MAMMOGRAPHY

## 2019-08-18 LAB — HM PAP SMEAR

## 2019-09-13 ENCOUNTER — Other Ambulatory Visit: Payer: Self-pay

## 2019-09-13 ENCOUNTER — Encounter: Payer: Self-pay | Admitting: Family Medicine

## 2019-09-13 ENCOUNTER — Ambulatory Visit (INDEPENDENT_AMBULATORY_CARE_PROVIDER_SITE_OTHER): Payer: BC Managed Care – PPO | Admitting: Family Medicine

## 2019-09-13 VITALS — BP 105/60 | HR 81 | Temp 98.5°F | Ht 66.0 in | Wt 135.0 lb

## 2019-09-13 DIAGNOSIS — Z1211 Encounter for screening for malignant neoplasm of colon: Secondary | ICD-10-CM | POA: Diagnosis not present

## 2019-09-13 DIAGNOSIS — Z0000 Encounter for general adult medical examination without abnormal findings: Secondary | ICD-10-CM

## 2019-09-13 DIAGNOSIS — Z13220 Encounter for screening for lipoid disorders: Secondary | ICD-10-CM | POA: Diagnosis not present

## 2019-09-13 LAB — COMPREHENSIVE METABOLIC PANEL
ALT: 20 U/L (ref 0–35)
AST: 22 U/L (ref 0–37)
Albumin: 4.1 g/dL (ref 3.5–5.2)
Alkaline Phosphatase: 32 U/L — ABNORMAL LOW (ref 39–117)
BUN: 14 mg/dL (ref 6–23)
CO2: 27 mEq/L (ref 19–32)
Calcium: 9.3 mg/dL (ref 8.4–10.5)
Chloride: 104 mEq/L (ref 96–112)
Creatinine, Ser: 0.97 mg/dL (ref 0.40–1.20)
GFR: 61.44 mL/min (ref >60.00)
Glucose, Bld: 95 mg/dL (ref 70–99)
Potassium: 4.4 mEq/L (ref 3.5–5.1)
Sodium: 139 mEq/L (ref 135–145)
Total Bilirubin: 0.4 mg/dL (ref 0.2–1.2)
Total Protein: 6.5 g/dL (ref 6.0–8.3)

## 2019-09-13 LAB — LIPID PANEL
Cholesterol: 193 mg/dL (ref 0–200)
HDL: 81.6 mg/dL (ref >39.00)
LDL Cholesterol: 97 mg/dL (ref 0–99)
NonHDL: 111.79
Total CHOL/HDL Ratio: 2
Triglycerides: 74 mg/dL (ref 0.0–149.0)
VLDL: 14.8 mg/dL (ref 0.0–40.0)

## 2019-09-13 NOTE — Assessment & Plan Note (Signed)
Physical exam completed.  Encouraged continued healthy diet and exercise.  Referral for colonoscopy placed.  We will request mammogram and Pap smear results.  Patient declines flu vaccine and Covid vaccine.  I encouraged her to reconsider regarding the COVID-19 vaccine given risk of Covid if she were to get it again.  She reports she has had it twice.  Lab work as outlined below.

## 2019-09-13 NOTE — Progress Notes (Signed)
Tommi Rumps, MD Phone: [phone]  Kirsten Fletcher is a 49 y.o. female who presents today for CPE.  Diet: Healthy.  Tries to avoid sugar.  Eating chicken and tuna.  Eating vegetables.  No soda or sweet tea. Exercise: 5 to 6 days a week doing high intensity interval training and strength training. Pap smear: Completed through GYN.  Requesting records. Colonoscopy: Due Mammogram: Reports she had this on 08/18/2019 with negative results.  Requesting records. Family history-  Colon cancer: No  Breast cancer: No  Ovarian cancer: No Vaccines-   Flu: Declines  Tetanus: Up-to-date  COVID19: Declines HIV screening: Donates blood frequently Hep C Screening: Donates blood frequently Tobacco use: No Alcohol use: 1 a week Illicit Drug use: No Dentist: Yes Ophthalmology: Yes  Patient saw cardiology for her dyspnea following Covid.  Echo was normal.  She reports symptoms have improved.  Active Ambulatory Problems    Diagnosis Date Noted  . Shellfish allergy 05/01/2015  . Routine general medical examination at a health care facility 05/31/2015  . Palpitations 08/12/2016  . Malaise 10/28/2016  . Rhinitis 05/03/2017  . Constipation 09/12/2018  . Dyspnea on exertion 03/26/2019   Resolved Ambulatory Problems    Diagnosis Date Noted  . No Resolved Ambulatory Problems   Past Medical History:  Diagnosis Date  . Hyperemesis gravidarum     Family History  Problem Relation Age of Onset  . Diabetes Mother   . Diabetes Father   . Diabetes Maternal Grandmother   . Diabetes Maternal Grandfather   . Diabetes Paternal Grandmother   . Diabetes Paternal Grandfather   . Diabetes Brother   . Heart disease Maternal Aunt   . Colon cancer Neg Hx     Social History   Socioeconomic History  . Marital status: Married    Spouse name: Not on file  . Number of children: 2  . Years of education: Not on file  . Highest education level: Not on file  Occupational History  . Occupation: real  Conservation officer, historic buildings  Tobacco Use  . Smoking status: Never Smoker  . Smokeless tobacco: Never Used  Vaping Use  . Vaping Use: Never used  Substance and Sexual Activity  . Alcohol use: Not on file  . Drug use: No  . Sexual activity: Not on file  Other Topics Concern  . 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 Difficulty of Paying Living Expenses: Not on file  Food Insecurity:   . Worried About Charity fundraiser in the Last Year: Not on file  . Ran Out of Food in the Last Year: Not on file  Transportation Needs:   . Lack of Transportation (Medical): Not on file  . Lack of Transportation (Non-Medical): Not on file  Physical Activity:   . Days of Exercise per Week: Not on file  . Minutes of Exercise per Session: Not on file  Stress:   . Feeling of Stress : Not on file  Social Connections:   . Frequency of Communication with Friends and Family: Not on file  . Frequency of Social Gatherings with Friends and Family: Not on file  . Attends Religious Services: Not on file  . Active Member of Clubs or Organizations: Not on file  . Attends Archivist Meetings: Not on file  . Marital Status: Not on file  Intimate Partner Violence:   . Fear of Current or Ex-Partner: Not on file  . Emotionally Abused: Not on file  . Physically Abused: Not on file  . Sexually Abused: Not on file    ROS  General:  Negative for nexplained weight loss, fever Skin: Negative for new or changing mole, sore that won't heal HEENT: Negative for trouble hearing, trouble seeing, ringing in ears, mouth sores, hoarseness, change in voice, dysphagia. CV:  Negative for chest pain, dyspnea, edema, palpitations Resp: Negative for cough, dyspnea, hemoptysis GI: Negative for nausea, vomiting, diarrhea, constipation, abdominal pain, melena, hematochezia. GU: Negative for  dysuria, incontinence, urinary hesitance, hematuria, vaginal or penile discharge, polyuria, sexual difficulty, lumps in testicle or breasts MSK: Negative for muscle cramps or aches, joint pain or swelling Neuro: Negative for headaches, weakness, numbness, dizziness, passing out/fainting Psych: Negative for depression, anxiety, memory problems  Objective  Physical Exam Vitals:   09/13/19 0844  BP: 105/60  Pulse: 81  Temp: 98.5 F (36.9 C)  SpO2: 99%    BP Readings from Last 3 Encounters:  09/13/19 105/60  05/31/19 110/64  04/28/19 110/70   Wt Readings from Last 3 Encounters:  09/13/19 135 lb (61.2 kg)  05/31/19 142 lb (64.4 kg)  04/28/19 149 lb 12.8 oz (67.9 kg)    Physical Exam Constitutional:      General: She is not in acute distress.    Appearance: She is not diaphoretic.  HENT:     Head: Normocephalic and atraumatic.  Eyes:     Conjunctiva/sclera: Conjunctivae normal.     Pupils: Pupils are equal, round, and reactive to light.  Cardiovascular:     Rate and Rhythm: Normal rate and regular rhythm.     Heart sounds: Normal heart sounds.  Pulmonary:     Effort: Pulmonary effort is normal.     Breath sounds: Normal breath sounds.  Abdominal:     General: Bowel sounds are normal. There is no distension.     Palpations: Abdomen is soft.     Tenderness: There is no abdominal tenderness. There is no guarding or rebound.  Musculoskeletal:     Right lower leg: No edema.     Left lower leg: No edema.  Skin:    General: Skin is warm and dry.  Neurological:     Mental Status: She is alert.  Psychiatric:        Mood and Affect: Mood normal.      Assessment/Plan:   Routine general medical examination at a health care facility Physical exam completed.  Encouraged continued healthy diet and exercise.  Referral for colonoscopy placed.  We will request mammogram and Pap smear results.  Patient declines flu vaccine and Covid vaccine.  I encouraged her to reconsider  regarding the COVID-19 vaccine given risk of Covid if she were to get it again.  She reports she has had it twice.  Lab work as outlined below. Patient follows with GYN for pelvic and breast exams.  Orders Placed This Encounter  Procedures  . Lipid panel  . Comp Met (CMET)  . Ambulatory referral to Gastroenterology    Referral Priority:   Routine    Referral Type:   Consultation    Referral Reason:   Specialty Services Required    Number of Visits Requested:   1    No orders of the defined types were placed in this encounter.   This visit occurred during the SARS-CoV-2 public health emergency.  Safety protocols were in place,  including screening questions prior to the visit, additional usage of staff PPE, and extensive cleaning of exam room while observing appropriate contact time as indicated for disinfecting solutions.    Tommi Rumps, MD [place]

## 2019-09-13 NOTE — Patient Instructions (Signed)
Nice to see you. We will get labs today. We will request records for your mammogram and Pap smear. Please continue with diet and exercise.

## 2019-09-20 ENCOUNTER — Encounter: Payer: Self-pay | Admitting: *Deleted

## 2019-09-25 ENCOUNTER — Telehealth: Payer: Self-pay | Admitting: Family Medicine

## 2019-09-25 NOTE — Telephone Encounter (Signed)
Rejection Reason - Patient did not respondundefined" [facility] said about 7 hours ago  Mailed "unable to contact" letter to home and referring provider.

## 2019-09-26 NOTE — Telephone Encounter (Signed)
Please call the patient and let her know that GI has been trying to contact her.  She should try to contact them when she is able to.  Thanks.

## 2019-09-26 NOTE — Telephone Encounter (Signed)
LVM for the patient to call [facility] and schedule I left the phone number to [facility] for the patient to call and schedule.  Ramie Palladino,cma

## 2020-03-12 DIAGNOSIS — M25511 Pain in right shoulder: Secondary | ICD-10-CM | POA: Diagnosis not present

## 2020-03-22 DIAGNOSIS — M25511 Pain in right shoulder: Secondary | ICD-10-CM | POA: Diagnosis not present

## 2020-05-13 LAB — CBC AND DIFFERENTIAL
HCT: 40 (ref 36–46)
Hemoglobin: 13.7 (ref 12.0–16.0)
Platelets: 208 (ref 150–399)
WBC: 5.9

## 2020-05-13 LAB — VITAMIN D 25 HYDROXY (VIT D DEFICIENCY, FRACTURES): Vit D, 25-Hydroxy: 55.8

## 2020-05-13 LAB — CBC: RBC: 4.49 (ref 3.87–5.11)

## 2020-05-13 LAB — TSH: TSH: 0.5 (ref <=5.90)

## 2020-05-13 LAB — HEPATIC FUNCTION PANEL
ALT: 23 (ref 7–35)
AST: 27 (ref 13–35)
Bilirubin, Total: 0.4

## 2020-05-13 LAB — TESTOSTERONE: Testosterone: 11

## 2020-05-13 LAB — COMPREHENSIVE METABOLIC PANEL: Albumin: 4.4 (ref 3.5–5.0)

## 2020-05-18 LAB — LIPID PANEL
Cholesterol: 219 — AB (ref 0–200)
HDL: 97 — AB (ref 35–70)
LDL Cholesterol: 108
Triglycerides: 82 (ref 40–160)

## 2020-06-05 ENCOUNTER — Ambulatory Visit: Payer: BC Managed Care – PPO | Admitting: Dermatology

## 2020-06-05 ENCOUNTER — Other Ambulatory Visit: Payer: Self-pay

## 2020-06-05 ENCOUNTER — Encounter: Payer: Self-pay | Admitting: Dermatology

## 2020-06-05 DIAGNOSIS — L820 Inflamed seborrheic keratosis: Secondary | ICD-10-CM | POA: Diagnosis not present

## 2020-06-05 DIAGNOSIS — C44511 Basal cell carcinoma of skin of breast: Secondary | ICD-10-CM

## 2020-06-05 DIAGNOSIS — Z86018 Personal history of other benign neoplasm: Secondary | ICD-10-CM | POA: Diagnosis not present

## 2020-06-05 DIAGNOSIS — Z85828 Personal history of other malignant neoplasm of skin: Secondary | ICD-10-CM

## 2020-06-05 DIAGNOSIS — L578 Other skin changes due to chronic exposure to nonionizing radiation: Secondary | ICD-10-CM | POA: Diagnosis not present

## 2020-06-05 DIAGNOSIS — D485 Neoplasm of uncertain behavior of skin: Secondary | ICD-10-CM

## 2020-06-05 NOTE — Patient Instructions (Signed)
Wound Care Instructions  1. Cleanse wound gently with soap and water once a day then pat dry with clean gauze. Apply a thing coat of Petrolatum (petroleum jelly, "Vaseline") over the wound (unless you have an allergy to this). We recommend that you use a new, sterile tube of Vaseline. Do not pick or remove scabs. Do not remove the yellow or white "healing tissue" from the base of the wound.  2. Cover the wound with fresh, clean, nonstick gauze and secure with paper tape. You may use Band-Aids in place of gauze and tape if the would is small enough, but would recommend trimming much of the tape off as there is often too much. Sometimes Band-Aids can irritate the skin.  3. You should call the office for your biopsy report after 1 week if you have not already been contacted.  4. If you experience any problems, such as abnormal amounts of bleeding, swelling, significant bruising, significant pain, or evidence of infection, please call the office immediately.  5. FOR ADULT SURGERY PATIENTS: If you need something for pain relief you may take 1 extra strength Tylenol (acetaminophen) AND 2 Ibuprofen (200mg  each) together every 4 hours as needed for pain. (do not take these if you are allergic to them or if you have a reason you should not take them.) Typically, you may only need pain medication for 1 to 3 days.    Cryotherapy Aftercare  . Wash gently with soap and water everyday.   Marland Kitchen Apply Vaseline and Band-Aid daily until healed.   If you have any questions or concerns for your doctor, please call our main line at [phone] and press option 4 to reach your doctor's medical assistant. If no one answers, please leave a voicemail as directed and we will return your call as soon as possible. Messages left after 4 pm will be answered the following business day.   You may also send [place] a message via [place]. We typically respond to MyChart messages within 1-2 business days.  For prescription refills,  please ask your pharmacy to contact our office. Our fax number is [phone].  If you have an urgent issue when the clinic is closed that cannot wait until the next business day, you can page your doctor at the number below.    Please note that while we do our best to be available for urgent issues outside of office hours, we are not available 24/7.   If you have an urgent issue and are unable to reach [place], you may choose to seek medical care at your doctor's office, retail clinic, urgent care center, or emergency room.  If you have a medical emergency, please immediately call 911 or go to the emergency department.  Pager Numbers  - Dr. Nehemiah Massed: [phone]  - Dr. Laurence Ferrari: [phone]  - Dr. Nicole Kindred: [phone]  In the event of inclement weather, please call our main line at [phone] for an update on the status of any delays or closures.  Dermatology Medication Tips: Please keep the boxes that topical medications come in in order to help keep track of the instructions about where and how to use these. Pharmacies typically print the medication instructions only on the boxes and not directly on the medication tubes.   If your medication is too expensive, please contact our office at [phone] option 4 or send [place] a message through [place].   We are unable to tell what your co-pay for medications will be in advance as this is different  depending on your insurance coverage. However, we may be able to find a substitute medication at lower cost or fill out paperwork to get insurance to cover a needed medication.   If a prior authorization is required to get your medication covered by your insurance company, please allow [place] 1-2 business days to complete this process.  Drug prices often vary depending on where the prescription is filled and some pharmacies may offer cheaper prices.  The website www.goodrx.com contains coupons for medications through different pharmacies. The prices  here do not account for what the cost may be with help from insurance (it may be cheaper with your insurance), but the website can give you the price if you did not use any insurance.  - You can print the associated coupon and take it with your prescription to the pharmacy.  - You may also stop by our office during regular business hours and pick up a GoodRx coupon card.  - If you need your prescription sent electronically to a different pharmacy, notify our office through [facility] or by phone at [phone] option 4.

## 2020-06-05 NOTE — Progress Notes (Signed)
Follow-Up Visit   Subjective  Kirsten Fletcher is a 50 y.o. female who presents for the following: Other (Spot on right post shoulder x ~1 year that gets very tender and scabs).  She also has several other areas of she would like evaluated.  Spot on her right breast has been crusted and not healing.  The following portions of the chart were reviewed this encounter and updated as appropriate:   Tobacco  Allergies  Meds  Problems  Med Hx  Surg Hx  Fam Hx     Review of Systems:  No other skin or systemic complaints except as noted in HPI or Assessment and Plan.  Objective  Well appearing patient in no apparent distress; mood and affect are within normal limits.  A focused examination was performed including back, chest. Relevant physical exam findings are noted in the Assessment and Plan.  Objective  Right Shoulder - Posterior: Erythematous keratotic or waxy stuck-on papule or plaque.   Objective  Right lat breast: 1.5 cm pink patch   Assessment & Plan    Actinic Damage - chronic, secondary to cumulative UV radiation exposure/sun exposure over time - diffuse scaly erythematous macules with underlying dyspigmentation - Recommend daily broad spectrum sunscreen SPF 30+ to sun-exposed areas, reapply every 2 hours as needed.  - Recommend staying in the shade or wearing long sleeves, sun glasses (UVA+UVB protection) and wide brim hats (4-inch brim around the entire circumference of the hat). - Call for new or changing lesions.  History of Basal Cell Carcinoma of the Skin - No evidence of recurrence today - Recommend regular full body skin exams - Recommend daily broad spectrum sunscreen SPF 30+ to sun-exposed areas, reapply every 2 hours as needed.  - Call if any new or changing lesions are noted between office visits  History of Squamous Cell Carcinoma of the Skin - No evidence of recurrence today - No lymphadenopathy - Recommend regular full body skin exams - Recommend  daily broad spectrum sunscreen SPF 30+ to sun-exposed areas, reapply every 2 hours as needed.  - Call if any new or changing lesions are noted between office visits  History of Dysplastic Nevi - No evidence of recurrence today - Recommend regular full body skin exams - Recommend daily broad spectrum sunscreen SPF 30+ to sun-exposed areas, reapply every 2 hours as needed.  - Call if any new or changing lesions are noted between office visits  Inflamed seborrheic keratosis Right Shoulder - Posterior  Destruction of lesion - Right Shoulder - Posterior Complexity: simple   Destruction method: cryotherapy   Informed consent: discussed and consent obtained   Timeout:  patient name, date of birth, surgical site, and procedure verified Lesion destroyed using liquid nitrogen: Yes   Region frozen until ice ball extended beyond lesion: Yes   Outcome: patient tolerated procedure well with no complications   Post-procedure details: wound care instructions given    Neoplasm of uncertain behavior of skin Right lat breast  Epidermal / dermal shaving  Lesion diameter (cm):  1.5 Informed consent: discussed and consent obtained   Timeout: patient name, date of birth, surgical site, and procedure verified   Procedure prep:  Patient was prepped and draped in usual sterile fashion Prep type:  Isopropyl alcohol Anesthesia: the lesion was anesthetized in a standard fashion   Anesthetic:  1% lidocaine w/ epinephrine 1-100,000 buffered w/ 8.4% NaHCO3 Instrument used: flexible razor blade   Hemostasis achieved with: pressure, aluminum chloride and electrodesiccation   Outcome:  patient tolerated procedure well   Post-procedure details: sterile dressing applied and wound care instructions given   Dressing type: bandage and petrolatum    Destruction of lesion Complexity: extensive   Destruction method: electrodesiccation and curettage   Informed consent: discussed and consent obtained   Timeout:   patient name, date of birth, surgical site, and procedure verified Procedure prep:  Patient was prepped and draped in usual sterile fashion Prep type:  Isopropyl alcohol Anesthesia: the lesion was anesthetized in a standard fashion   Anesthetic:  1% lidocaine w/ epinephrine 1-100,000 buffered w/ 8.4% NaHCO3 Curettage performed in three different directions: Yes   Electrodesiccation performed over the curetted area: Yes   Lesion length (cm):  1.5 Lesion width (cm):  1.5 Margin per side (cm):  0.2 Final wound size (cm):  1.9 Hemostasis achieved with:  pressure and aluminum chloride Outcome: patient tolerated procedure well with no complications   Post-procedure details: sterile dressing applied and wound care instructions given   Dressing type: bandage and petrolatum    Specimen 1 - Surgical pathology Differential Diagnosis: BCC vs other  Check Margins: No 1.2 cm pink patch EDC today  Basal cell carcinoma (BCC) of skin of right breast  Return in about 6 months (around 12/06/2020).  I, Ashok Cordia, CMA, am acting as scribe for Sarina Ser, MD .  Documentation: I have reviewed the above documentation for accuracy and completeness, and I agree with the above.  Sarina Ser, MD

## 2020-06-10 ENCOUNTER — Encounter: Payer: Self-pay | Admitting: Dermatology

## 2020-06-17 ENCOUNTER — Telehealth: Payer: Self-pay

## 2020-06-17 NOTE — Telephone Encounter (Signed)
-----   Message from Ralene Bathe, MD sent at 06/14/2020 12:46 PM EDT ----- Diagnosis Skin , Right lat breast BASAL CELL CARCINOMA, SUPERFICIAL AND NODULAR PATTERNS, CLOSE TO MARGIN  Cancer-BCC Already treated Recheck next visit

## 2020-06-17 NOTE — Telephone Encounter (Signed)
Patient informed of pathology results 

## 2020-08-29 LAB — HM MAMMOGRAPHY

## 2020-09-13 ENCOUNTER — Ambulatory Visit (INDEPENDENT_AMBULATORY_CARE_PROVIDER_SITE_OTHER): Payer: BC Managed Care – PPO | Admitting: Family Medicine

## 2020-09-13 ENCOUNTER — Encounter: Payer: Self-pay | Admitting: Family Medicine

## 2020-09-13 ENCOUNTER — Other Ambulatory Visit: Payer: Self-pay

## 2020-09-13 VITALS — BP 105/60 | HR 70 | Temp 97.7°F | Ht 66.0 in | Wt 146.0 lb

## 2020-09-13 DIAGNOSIS — Z1211 Encounter for screening for malignant neoplasm of colon: Secondary | ICD-10-CM | POA: Diagnosis not present

## 2020-09-13 DIAGNOSIS — Z0000 Encounter for general adult medical examination without abnormal findings: Secondary | ICD-10-CM

## 2020-09-13 NOTE — Patient Instructions (Signed)
Nice to see you. Please continue with healthy diet and exercise. We will monitor your cholesterol yearly. Please make sure you always take the progesterone while you have estrogen pellets or any estrogen supplementation in your system.

## 2020-09-13 NOTE — Assessment & Plan Note (Signed)
Physical exam completed.  Encouraged continued healthy diet and exercise.  Discussed that she needs to remain on progesterone as long as she has an exogenous estrogen hormone replacement in her system.  Discussed the risk of endometrial cancer with unopposed estrogen.  Discussed the risk of breast cancer as well as blood clot and cardiovascular disease with hormone supplementation.  This is managed by her specialist.  Referral for colonoscopy placed.  Lab work reviewed.  She does not meet criteria for cholesterol medication.  We will monitor her lab work yearly.

## 2020-09-13 NOTE — Progress Notes (Signed)
Tommi Rumps, MD Phone: [phone]  Kirsten Fletcher is a 50 y.o. female who presents today for CPE.  Diet: generally healthy, has cut out sugar Exercise: 6 days a week with HIIT Pap smear: UTD, reports through GYN several weeks ago Colonoscopy: due Mammogram: UTD, negative through GYN Family history-  Colon cancer: no  Breast cancer: maternal aunt  Ovarian cancer: no Menses: LMP February, currently on hormone replacement for menopausal symptoms Vaccines-   Flu: declines  Tetanus: defers  COVID19: defers HIV screening: previously deferred Hep C Screening: previously deferred Tobacco use: no Alcohol use: 2/week Illicit Drug use: no Dentist: yes Ophthalmology: yes   Active Ambulatory Problems    Diagnosis Date Noted   Shellfish allergy 05/01/2015   Routine general medical examination at a health care facility 05/31/2015   Palpitations 08/12/2016   Malaise 10/28/2016   Rhinitis 05/03/2017   Constipation 09/12/2018   Dyspnea on exertion 03/26/2019   Resolved Ambulatory Problems    Diagnosis Date Noted   No Resolved Ambulatory Problems   Past Medical History:  Diagnosis Date   Basal cell carcinoma 07/20/2012   Basal cell carcinoma 06/27/2015   Basal cell carcinoma 01/08/2016   Basal cell carcinoma 06/05/2020   Dysplastic nevus 06/27/2015   Hyperemesis gravidarum    Squamous cell carcinoma of skin 10/05/2013    Family History  Problem Relation Age of Onset   Diabetes Mother    Diabetes Father    Diabetes Maternal Grandmother    Diabetes Maternal Grandfather    Diabetes Paternal Grandmother    Diabetes Paternal Grandfather    Diabetes Brother    Heart disease Maternal Aunt    Colon cancer Neg Hx     Social History   Socioeconomic History   Marital status: Married    Spouse name: Not on file   Number of children: 2   Years of education: Not on file   Highest education level: Not on file  Occupational History   Occupation: real estate agent   Tobacco Use   Smoking status: Never   Smokeless tobacco: Never  Vaping Use   Vaping Use: Never used  Substance and Sexual Activity   Alcohol use: Not on file   Drug use: No   Sexual activity: Not on file  Other Topics Concern   Not on file  Social History Narrative   married, 2 daughters, never smoker no alcohol no caffeine, she is a Physicist, medical of Radio broadcast assistant Strain: Not on file  Food Insecurity: Not on file  Transportation Needs: Not on file  Physical Activity: Not on file  Stress: Not on file  Social Connections: Not on file  Intimate Partner Violence: Not on file    ROS  General:  Negative for nexplained weight loss, fever Skin: Negative for new or changing mole, sore that won't heal HEENT: Negative for trouble hearing, trouble seeing, ringing in ears, mouth sores, hoarseness, change in voice, dysphagia. CV:  Negative for chest pain, dyspnea, edema, palpitations Resp: Negative for cough, dyspnea, hemoptysis GI: Negative for nausea, vomiting, diarrhea, constipation, abdominal pain, melena, hematochezia. GU: Negative for dysuria, incontinence, urinary hesitance, hematuria, vaginal or penile discharge, polyuria, sexual difficulty, lumps in testicle or breasts MSK: Negative for muscle cramps or aches, joint pain or swelling Neuro: Negative for headaches, weakness, numbness, dizziness, passing out/fainting Psych: Negative for depression, anxiety, memory problems  Objective  Physical Exam Vitals:   09/13/20 0836  BP: 105/60  Pulse: 70  Temp:  97.7 F (36.5 C)  SpO2: 99%    BP Readings from Last 3 Encounters:  09/13/20 105/60  09/13/19 105/60  05/31/19 110/64   Wt Readings from Last 3 Encounters:  09/13/20 146 lb (66.2 kg)  09/13/19 135 lb (61.2 kg)  05/31/19 142 lb (64.4 kg)    Physical Exam Constitutional:      General: She is not in acute distress.    Appearance: She is not diaphoretic.  HENT:     Head: Normocephalic  and atraumatic.  Eyes:     Conjunctiva/sclera: Conjunctivae normal.     Pupils: Pupils are equal, round, and reactive to light.  Cardiovascular:     Rate and Rhythm: Normal rate and regular rhythm.     Heart sounds: Normal heart sounds.  Pulmonary:     Effort: Pulmonary effort is normal.     Breath sounds: Normal breath sounds.  Abdominal:     General: Bowel sounds are normal. There is no distension.     Palpations: Abdomen is soft.     Tenderness: There is no abdominal tenderness. There is no guarding or rebound.  Musculoskeletal:     Right lower leg: No edema.     Left lower leg: No edema.  Lymphadenopathy:     Cervical: No cervical adenopathy.  Skin:    General: Skin is warm and dry.  Neurological:     Mental Status: She is alert.  Psychiatric:        Mood and Affect: Mood normal.     Assessment/Plan:   Problem List Items Addressed This Visit     Routine general medical examination at a health care facility - Primary    Physical exam completed.  Encouraged continued healthy diet and exercise.  Discussed that she needs to remain on progesterone as long as she has an exogenous estrogen hormone replacement in her system.  Discussed the risk of endometrial cancer with unopposed estrogen.  Discussed the risk of breast cancer as well as blood clot and cardiovascular disease with hormone supplementation.  This is managed by her specialist.  Referral for colonoscopy placed.  Lab work reviewed.  She does not meet criteria for cholesterol medication.  We will monitor her lab work yearly.      Other Visit Diagnoses     Colon cancer screening       Relevant Orders   Ambulatory referral to Gastroenterology       Return in about 1 year (around 09/13/2021) for CPE.  This visit occurred during the SARS-CoV-2 public health emergency.  Safety protocols were in place, including screening questions prior to the visit, additional usage of staff PPE, and extensive cleaning of exam room  while observing appropriate contact time as indicated for disinfecting solutions.    Tommi Rumps, MD [place]

## 2020-09-18 ENCOUNTER — Other Ambulatory Visit: Payer: Self-pay

## 2020-09-18 NOTE — Progress Notes (Signed)
Unable to contact for cc screening. Letter will be sent out.

## 2020-10-31 ENCOUNTER — Telehealth: Payer: Self-pay | Admitting: Family Medicine

## 2020-10-31 NOTE — Telephone Encounter (Signed)
Patient returned office phone call. 

## 2020-10-31 NOTE — Telephone Encounter (Signed)
Patient called in stating that she was told by Access Nurse that she will need to be seen within 3 days.Offered to schedule patient for a virtual visit today with Dr.Kim at the [facility] location and patient is not able to make the appointment.Patient is scheduled for a visit on 11/05/20 with Dutch Quint NP.

## 2020-10-31 NOTE — Telephone Encounter (Signed)
Left a message to call back.

## 2020-10-31 NOTE — Telephone Encounter (Signed)
Patient informed, Due to the high volume of calls and your symptoms we have to forward your call to our Triage Nurse to expedient your call. Please hold for the transfer.  Patient transferred to [facility] at Access Nurse. Due to having trouble with her heart racing and trouble breathing.She is unsure if this is related to when she had COVID in the past and she recently had an episode on Sunday while on an air plane as well as this morning.No openings in office or virtual.

## 2020-11-01 NOTE — Telephone Encounter (Signed)
Noted. Agree with evaluation on Tuesday for this unless it recurs.

## 2020-11-01 NOTE — Telephone Encounter (Signed)
Called and spoke to Abrina to discuss Access Nurse note. Pt states that her heart was racing on Sunday and on Wednesday. Pt states that on Sunday she was on an airplane and was having anxiety and thoughts that she was going to die and no one could help her. On Wednesday she was coming home from the gym and was driving to a realtor to view property when her heart started to race. Pt states that she had no chest pain, sob, or chest tightness. Pt did have Dizziness, cold sweats, and increased pulse. Pt declined going to the urgent care. Pt stated that she feels fine after it happens and believes herself to be able to wait until Tuesday. Again stressed the importance of being evaluated with the random bouts of increased pulse. Informed the patient that she may need an EKG done to evaluate her heart and that she would need to go to the urgent care to get it done. Pt again declined, stating that she could wait until Tuesday and that if she feels any repeated symptoms she will go to the urgent care for evaluation. Pt had no other questions or concerns.

## 2020-11-05 ENCOUNTER — Other Ambulatory Visit: Payer: Self-pay

## 2020-11-05 ENCOUNTER — Ambulatory Visit: Payer: BC Managed Care – PPO | Admitting: Family

## 2020-11-05 ENCOUNTER — Encounter: Payer: Self-pay | Admitting: Family

## 2020-11-05 VITALS — BP 114/78 | HR 82 | Temp 97.1°F | Ht 65.98 in | Wt 146.8 lb

## 2020-11-05 DIAGNOSIS — R0602 Shortness of breath: Secondary | ICD-10-CM | POA: Diagnosis not present

## 2020-11-05 DIAGNOSIS — R000 Tachycardia, unspecified: Secondary | ICD-10-CM | POA: Diagnosis not present

## 2020-11-06 LAB — COMPREHENSIVE METABOLIC PANEL
ALT: 15 U/L (ref 0–35)
AST: 21 U/L (ref 0–37)
Albumin: 4.6 g/dL (ref 3.5–5.2)
Alkaline Phosphatase: 43 U/L (ref 39–117)
BUN: 17 mg/dL (ref 6–23)
CO2: 27 mEq/L (ref 19–32)
Calcium: 9.6 mg/dL (ref 8.4–10.5)
Chloride: 103 mEq/L (ref 96–112)
Creatinine, Ser: 0.79 mg/dL (ref 0.40–1.20)
GFR: 88.34 mL/min (ref >60.00)
Glucose, Bld: 84 mg/dL (ref 70–99)
Potassium: 4 mEq/L (ref 3.5–5.1)
Sodium: 137 mEq/L (ref 135–145)
Total Bilirubin: 0.4 mg/dL (ref 0.2–1.2)
Total Protein: 6.9 g/dL (ref 6.0–8.3)

## 2020-11-06 LAB — CBC WITH DIFFERENTIAL/PLATELET
Basophils Absolute: 0 10*3/uL (ref 0.0–0.1)
Basophils Relative: 0.5 % (ref 0.0–3.0)
Eosinophils Absolute: 0 10*3/uL (ref 0.0–0.7)
Eosinophils Relative: 0.3 % (ref 0.0–5.0)
HCT: 42 % (ref 36.0–46.0)
Hemoglobin: 14.1 g/dL (ref 12.0–15.0)
Lymphocytes Relative: 17.4 % (ref 12.0–46.0)
Lymphs Abs: 1.5 10*3/uL (ref 0.7–4.0)
MCHC: 33.6 g/dL (ref 30.0–36.0)
MCV: 90.4 fl (ref 78.0–100.0)
Monocytes Absolute: 0.7 10*3/uL (ref 0.1–1.0)
Monocytes Relative: 8 % (ref 3.0–12.0)
Neutro Abs: 6.5 10*3/uL (ref 1.4–7.7)
Neutrophils Relative %: 73.8 % (ref 43.0–77.0)
Platelets: 194 10*3/uL (ref 150.0–400.0)
RBC: 4.65 Mil/uL (ref 3.87–5.11)
RDW: 13.4 % (ref 11.5–15.5)
WBC: 8.8 10*3/uL (ref 4.0–10.5)

## 2020-11-06 LAB — THYROID PANEL WITH TSH
Free Thyroxine Index: 1.8 (ref 1.4–3.8)
T3 Uptake: 32 % (ref 22–35)
T4, Total: 5.5 ug/dL (ref 5.1–11.9)
TSH: 0.86 mIU/L

## 2020-11-08 ENCOUNTER — Telehealth: Payer: Self-pay

## 2020-11-08 NOTE — Telephone Encounter (Signed)
-----   Message from Kennyth Arnold, [place] sent at 11/07/2020  5:17 PM EDT ----- Labs normal

## 2020-11-10 ENCOUNTER — Encounter: Payer: Self-pay | Admitting: Family

## 2020-11-10 NOTE — Progress Notes (Signed)
Acute Office Visit  Subjective:    Patient ID: Kirsten Fletcher, female    DOB: September 09, 1970, 50 y.o.   MRN: [id number]  Chief Complaint  Patient presents with  . Tachycardia  . Shortness of Breath    HPI Patient is in today with concerns of shortness of breath and heart racing. She has had 2 episodes that seems to be related to caffeine consumption. She reports being in [place] and drinking more alcoholic beverages than normal. She later experienced increased hear rate of about 135bpm while on the airplane heading home. She reports the episode lasting about 1.5 hours and resolving after drinking a sprite and using an ice pack. The second experience was after drinking a pre-workout and working out at [employer]. She works out 6-7 days per week and never has issues with chest pain, palpitations or SOB.  She had a similar experience in 2017.   Past Medical History:  Diagnosis Date  . Basal cell carcinoma 07/20/2012   Left post deltoid, left chest  . Basal cell carcinoma 06/27/2015   right upper back paraspinal, right epigastric  . Basal cell carcinoma 01/08/2016   left medial head of clavicle  . Basal cell carcinoma 06/05/2020   R lat breast - ED&C   . Dysplastic nevus 06/27/2015   right mid side  . Hyperemesis gravidarum   . Squamous cell carcinoma of skin 10/05/2013   left sternum    History reviewed. No pertinent surgical history.  Family History  Problem Relation Age of Onset  . Diabetes Mother   . Diabetes Father   . Diabetes Maternal Grandmother   . Diabetes Maternal Grandfather   . Diabetes Paternal Grandmother   . Diabetes Paternal Grandfather   . Diabetes Brother   . Heart disease Maternal Aunt   . Colon cancer Neg Hx     Social History   Socioeconomic History  . Marital status: Married    Spouse name: Not on file  . Number of children: 2  . Years of education: Not on file  . Highest education level: Not on file  Occupational History  . Occupation: real Counselling psychologist  Tobacco Use  . Smoking status: Never  . Smokeless tobacco: Never  Vaping Use  . Vaping Use: Never used  Substance and Sexual Activity  . Alcohol use: Not on file  . Drug use: No  . Sexual activity: Not on file  Other Topics Concern  . Not on file  Social History Narrative   married, 2 daughters, never smoker no alcohol no caffeine, she is a Patent examiner Strain: Not on file  Food Insecurity: Not on file  Transportation Needs: Not on file  Physical Activity: Not on file  Stress: Not on file  Social Connections: Not on file  Intimate Partner Violence: Not on file    Outpatient Medications Prior to Visit  Medication Sig Dispense Refill  . progesterone (PROMETRIUM) 200 MG capsule Take 200 mg by mouth daily.     No facility-administered medications prior to visit.    Allergies  Allergen Reactions  . Shellfish Allergy Anaphylaxis    Swelling of throat  . Latex Rash    Review of Systems  Constitutional: Negative.   HENT: Negative.    Respiratory:  Positive for shortness of breath. Negative for chest tightness and wheezing.   Cardiovascular:  Positive for chest pain.  Gastrointestinal: Negative.   Endocrine: Negative.   Genitourinary: Negative.  Musculoskeletal: Negative.   Skin: Negative.   Allergic/Immunologic: Negative.   Neurological: Negative.   Hematological: Negative.   Psychiatric/Behavioral: Negative.        Objective:    Physical Exam Vitals and nursing note reviewed.  Constitutional:      Appearance: She is well-developed.  Cardiovascular:     Rate and Rhythm: Normal rate and regular rhythm.  Pulmonary:     Effort: Pulmonary effort is normal.     Breath sounds: Normal breath sounds.  Chest:     Chest wall: No tenderness.  Abdominal:     General: Bowel sounds are normal.     Palpations: Abdomen is soft.  Musculoskeletal:        General: Normal range of motion.     Cervical back: Normal  range of motion and neck supple.  Skin:    General: Skin is warm and dry.  Neurological:     General: No focal deficit present.     Mental Status: She is alert and oriented to person, place, and time.  Psychiatric:        Mood and Affect: Mood normal.   BP 114/78   Pulse 82   Temp (!) 97.1 F (36.2 C)   Ht 5' 5.98" (1.676 m)   Wt 146 lb 12.8 oz (66.6 kg)   SpO2 97%   BMI 23.71 kg/m  Wt Readings from Last 3 Encounters:  11/05/20 146 lb 12.8 oz (66.6 kg)  09/13/20 146 lb (66.2 kg)  09/13/19 135 lb (61.2 kg)    Health Maintenance Due  Topic Date Due  . COVID-19 Vaccine (1) Never done  . COLONOSCOPY (Pts 45-64yr Insurance coverage will need to be confirmed)  Never done    There are no preventive care reminders to display for this patient.   Lab Results  Component Value Date   TSH 0.86 11/05/2020   Lab Results  Component Value Date   WBC 8.8 11/05/2020   HGB 14.1 11/05/2020   HCT 42.0 11/05/2020   MCV 90.4 11/05/2020   PLT 194.0 11/05/2020   Lab Results  Component Value Date   NA 137 11/05/2020   K 4.0 11/05/2020   CO2 27 11/05/2020   GLUCOSE 84 11/05/2020   BUN 17 11/05/2020   CREATININE 0.79 11/05/2020   BILITOT 0.4 11/05/2020   ALKPHOS 43 11/05/2020   AST 21 11/05/2020   ALT 15 11/05/2020   PROT 6.9 11/05/2020   ALBUMIN 4.6 11/05/2020   CALCIUM 9.6 11/05/2020   GFR 88.34 11/05/2020   Lab Results  Component Value Date   CHOL 219 (A) 05/13/2020   Lab Results  Component Value Date   HDL 97 (A) 05/13/2020   Lab Results  Component Value Date   LDLCALC 108 05/13/2020   Lab Results  Component Value Date   TRIG 82 05/13/2020   Lab Results  Component Value Date   CHOLHDL 2 09/13/2019   Lab Results  Component Value Date   HGBA1C 5.1 05/24/2015       Assessment & Plan:   Problem List Items Addressed This Visit   None Visit Diagnoses     Racing heart beat    -  Primary   Relevant Orders   EKG 12-Lead (Completed)   Thyroid Panel With  TSH (Completed)   Comp Met (CMET) (Completed)   CBC w/Diff (Completed)   Holter monitor - 48 hour   Shortness of breath       Relevant Orders   EKG 12-Lead (Completed)  Thyroid Panel With TSH (Completed)   Comp Met (CMET) (Completed)   CBC w/Diff (Completed)   Holter monitor - 48 hour        EKG is normal. Labs obtained with notify patient pending results. Refer to cardiology for holter monitor. Will discuss any additional intervention necessary thereafter. Call if symptoms worsen.    Kennyth Arnold, FNP

## 2020-11-25 ENCOUNTER — Ambulatory Visit: Payer: BC Managed Care – PPO | Admitting: Dermatology

## 2021-05-15 ENCOUNTER — Encounter: Payer: Self-pay | Admitting: Family Medicine

## 2021-06-30 DIAGNOSIS — M545 Low back pain, unspecified: Secondary | ICD-10-CM | POA: Diagnosis not present

## 2021-06-30 DIAGNOSIS — M5416 Radiculopathy, lumbar region: Secondary | ICD-10-CM | POA: Diagnosis not present

## 2021-06-30 DIAGNOSIS — M4316 Spondylolisthesis, lumbar region: Secondary | ICD-10-CM | POA: Diagnosis not present

## 2021-08-11 DIAGNOSIS — M4316 Spondylolisthesis, lumbar region: Secondary | ICD-10-CM | POA: Diagnosis not present

## 2021-08-11 DIAGNOSIS — M545 Low back pain, unspecified: Secondary | ICD-10-CM | POA: Diagnosis not present

## 2021-08-11 DIAGNOSIS — M5416 Radiculopathy, lumbar region: Secondary | ICD-10-CM | POA: Diagnosis not present

## 2021-09-11 DIAGNOSIS — M4316 Spondylolisthesis, lumbar region: Secondary | ICD-10-CM | POA: Diagnosis not present

## 2021-09-17 ENCOUNTER — Encounter: Payer: BC Managed Care – PPO | Admitting: Family Medicine

## 2021-12-16 ENCOUNTER — Ambulatory Visit: Payer: BC Managed Care – PPO | Admitting: Dermatology

## 2021-12-16 DIAGNOSIS — C44529 Squamous cell carcinoma of skin of other part of trunk: Secondary | ICD-10-CM

## 2021-12-16 DIAGNOSIS — D489 Neoplasm of uncertain behavior, unspecified: Secondary | ICD-10-CM

## 2021-12-16 DIAGNOSIS — L570 Actinic keratosis: Secondary | ICD-10-CM

## 2021-12-16 DIAGNOSIS — C4492 Squamous cell carcinoma of skin, unspecified: Secondary | ICD-10-CM

## 2021-12-16 DIAGNOSIS — L578 Other skin changes due to chronic exposure to nonionizing radiation: Secondary | ICD-10-CM | POA: Diagnosis not present

## 2021-12-16 DIAGNOSIS — C4442 Squamous cell carcinoma of skin of scalp and neck: Secondary | ICD-10-CM | POA: Diagnosis not present

## 2021-12-16 HISTORY — DX: Squamous cell carcinoma of skin, unspecified: C44.92

## 2021-12-16 NOTE — Progress Notes (Signed)
Follow-Up Visit   Subjective  Kirsten Fletcher is a 51 y.o. female who presents for the following: Skin Problem (Patient here concerning a painful bump that has been growing since August at right neck she would like checked. ).  The following portions of the chart were reviewed this encounter and updated as appropriate:      Review of Systems: No other skin or systemic complaints except as noted in HPI or Assessment and Plan.   Objective  Well appearing patient in no apparent distress; mood and affect are within normal limits.  A focused examination was performed including right chest, right clavicle area. Relevant physical exam findings are noted in the Assessment and Plan.  right upper clavicle 8 mm pink scaly nodule      right chest x 1 Erythematous thin papules/macules with gritty scale.    Assessment & Plan  Neoplasm of uncertain behavior right upper clavicle  Epidermal / dermal shaving  Lesion diameter (cm):  0.8 Informed consent: discussed and consent obtained   Patient was prepped and draped in usual sterile fashion: Area prepped with alcohol. Anesthesia: the lesion was anesthetized in a standard fashion   Anesthetic:  1% lidocaine w/ epinephrine 1-100,000 buffered w/ 8.4% NaHCO3 Instrument used: flexible razor blade   Hemostasis achieved with: pressure, aluminum chloride and electrodesiccation   Outcome: patient tolerated procedure well    Destruction of lesion  Destruction method: electrodesiccation and curettage   Informed consent: discussed and consent obtained   Curettage performed in three different directions: Yes   Electrodesiccation performed over the curetted area: Yes   Final wound size (cm):  0.8 Hemostasis achieved with:  pressure, aluminum chloride and electrodesiccation Outcome: patient tolerated procedure well with no complications   Post-procedure details: wound care instructions given   Additional details:  Mupirocin ointment and Bandaid  applied   Specimen 1 - Surgical pathology Differential Diagnosis: R/o SCC  Check Margins: No  R/o scc Ed&c    Actinic keratosis right chest x 1  Actinic keratoses are precancerous spots that appear secondary to cumulative UV radiation exposure/sun exposure over time. They are chronic with expected duration over 1 year. A portion of actinic keratoses will progress to squamous cell carcinoma of the skin. It is not possible to reliably predict which spots will progress to skin cancer and so treatment is recommended to prevent development of skin cancer.  Recommend daily broad spectrum sunscreen SPF 30+ to sun-exposed areas, reapply every 2 hours as needed.  Recommend staying in the shade or wearing long sleeves, sun glasses (UVA+UVB protection) and wide brim hats (4-inch brim around the entire circumference of the hat). Call for new or changing lesions.  Destruction of lesion - right chest x 1  Destruction method: cryotherapy   Informed consent: discussed and consent obtained   Lesion destroyed using liquid nitrogen: Yes   Region frozen until ice ball extended beyond lesion: Yes   Outcome: patient tolerated procedure well with no complications   Post-procedure details: wound care instructions given   Additional details:  Prior to procedure, discussed risks of blister formation, small wound, skin dyspigmentation, or rare scar following cryotherapy. Recommend Vaseline ointment to treated areas while healing.    Actinic Damage - chronic, secondary to cumulative UV radiation exposure/sun exposure over time - diffuse scaly erythematous macules with underlying dyspigmentation - Recommend daily broad spectrum sunscreen SPF 30+ to sun-exposed areas, reapply every 2 hours as needed.  - Recommend staying in the shade or wearing long sleeves,  sun glasses (UVA+UVB protection) and wide brim hats (4-inch brim around the entire circumference of the hat). - Call for new or changing  lesions.  Return in about 3 months (around 03/17/2022) for tbse . I, Ruthell Rummage, CMA, am acting as scribe for Brendolyn Patty, MD.  Documentation: I have reviewed the above documentation for accuracy and completeness, and I agree with the above.  Brendolyn Patty MD

## 2021-12-16 NOTE — Patient Instructions (Addendum)
Electrodesiccation and Curettage ("Scrape and Burn") Wound Care Instructions  Leave the original bandage on for 24 hours if possible.  If the bandage becomes soaked or soiled before that time, it is OK to remove it and examine the wound.  A small amount of post-operative bleeding is normal.  If excessive bleeding occurs, remove the bandage, place gauze over the site and apply continuous pressure (no peeking) over the area for 30 minutes. If this does not work, please call our clinic as soon as possible or page your doctor if it is after hours.   Once a day, cleanse the wound with soap and water. It is fine to shower. If a thick crust develops you may use a Q-tip dipped into dilute hydrogen peroxide (mix 1:1 with water) to dissolve it.  Hydrogen peroxide can slow the healing process, so use it only as needed.    After washing, apply petroleum jelly (Vaseline) or an antibiotic ointment if your doctor prescribed one for you, followed by a bandage.    For best healing, the wound should be covered with a layer of ointment at all times. If you are not able to keep the area covered with a bandage to hold the ointment in place, this may mean re-applying the ointment several times a day.  Continue this wound care until the wound has healed and is no longer open. It may take several weeks for the wound to heal and close.  Itching and mild discomfort is normal during the healing process.  If you have any discomfort, you can take Tylenol (acetaminophen) or ibuprofen as directed on the bottle. (Please do not take these if you have an allergy to them or cannot take them for another reason).  Some redness, tenderness and white or yellow material in the wound is normal healing.  If the area becomes very sore and red, or develops a thick yellow-green material (pus), it may be infected; please notify us.    Wound healing continues for up to one year following surgery. It is not unusual to experience pain in the scar  from time to time during the interval.  If the pain becomes severe or the scar thickens, you should notify the office.    A slight amount of redness in a scar is expected for the first six months.  After six months, the redness will fade and the scar will soften and fade.  The color difference becomes less noticeable with time.  If there are any problems, return for a post-op surgery check at your earliest convenience.  To improve the appearance of the scar, you can use silicone scar gel, cream, or sheets (such as Mederma or Serica) every night for up to one year. These are available over the counter (without a prescription).  Please call our office at [phone] for any questions or concerns.  Biopsy Wound Care Instructions  Leave the original bandage on for 24 hours if possible.  If the bandage becomes soaked or soiled before that time, it is OK to remove it and examine the wound.  A small amount of post-operative bleeding is normal.  If excessive bleeding occurs, remove the bandage, place gauze over the site and apply continuous pressure (no peeking) over the area for 30 minutes. If this does not work, please call our clinic as soon as possible or page your doctor if it is after hours.   Once a day, cleanse the wound with soap and water. It is fine to shower. If   a thick crust develops you may use a Q-tip dipped into dilute hydrogen peroxide (mix 1:1 with water) to dissolve it.  Hydrogen peroxide can slow the healing process, so use it only as needed.    After washing, apply petroleum jelly (Vaseline) or an antibiotic ointment if your doctor prescribed one for you, followed by a bandage.    For best healing, the wound should be covered with a layer of ointment at all times. If you are not able to keep the area covered with a bandage to hold the ointment in place, this may mean re-applying the ointment several times a day.  Continue this wound care until the wound has healed and is no longer  open.   Itching and mild discomfort is normal during the healing process. However, if you develop pain or severe itching, please call our office.   If you have any discomfort, you can take Tylenol (acetaminophen) or ibuprofen as directed on the bottle. (Please do not take these if you have an allergy to them or cannot take them for another reason).  Some redness, tenderness and white or yellow material in the wound is normal healing.  If the area becomes very sore and red, or develops a thick yellow-green material (pus), it may be infected; please notify us.    If you have stitches, return to clinic as directed to have the stitches removed. You will continue wound care for 2-3 days after the stitches are removed.   Wound healing continues for up to one year following surgery. It is not unusual to experience pain in the scar from time to time during the interval.  If the pain becomes severe or the scar thickens, you should notify the office.    A slight amount of redness in a scar is expected for the first six months.  After six months, the redness will fade and the scar will soften and fade.  The color difference becomes less noticeable with time.  If there are any problems, return for a post-op surgery check at your earliest convenience.  To improve the appearance of the scar, you can use silicone scar gel, cream, or sheets (such as Mederma or Serica) every night for up to one year. These are available over the counter (without a prescription).  Please call our office at [phone] for any questions or concerns.    Actinic keratoses are precancerous spots that appear secondary to cumulative UV radiation exposure/sun exposure over time. They are chronic with expected duration over 1 year. A portion of actinic keratoses will progress to squamous cell carcinoma of the skin. It is not possible to reliably predict which spots will progress to skin cancer and so treatment is recommended to prevent  development of skin cancer.  Recommend daily broad spectrum sunscreen SPF 30+ to sun-exposed areas, reapply every 2 hours as needed.  Recommend staying in the shade or wearing long sleeves, sun glasses (UVA+UVB protection) and wide brim hats (4-inch brim around the entire circumference of the hat). Call for new or changing lesions.   Cryotherapy Aftercare  Wash gently with soap and water everyday.   Apply Vaseline and Band-Aid daily until healed.         Due to recent changes in healthcare laws, you may see results of your pathology and/or laboratory studies on MyChart before the doctors have had a chance to review them. We understand that in some cases there may be results that are confusing or concerning to you. Please understand  that not all results are received at the same time and often the doctors may need to interpret multiple results in order to provide you with the best plan of care or course of treatment. Therefore, we ask that you please give [place] 2 business days to thoroughly review all your results before contacting the office for clarification. Should we see a critical lab result, you will be contacted sooner.   If You Need Anything After Your Visit  If you have any questions or concerns for your doctor, please call our main line at [phone] and press option 4 to reach your doctor's medical assistant. If no one answers, please leave a voicemail as directed and we will return your call as soon as possible. Messages left after 4 pm will be answered the following business day.   You may also send [place] a message via [place]. We typically respond to MyChart messages within 1-2 business days.  For prescription refills, please ask your pharmacy to contact our office. Our fax number is [phone].  If you have an urgent issue when the clinic is closed that cannot wait until the next business day, you can page your doctor at the number below.    Please note that while we do our  best to be available for urgent issues outside of office hours, we are not available 24/7.   If you have an urgent issue and are unable to reach [place], you may choose to seek medical care at your doctor's office, retail clinic, urgent care center, or emergency room.  If you have a medical emergency, please immediately call 911 or go to the emergency department.  Pager Numbers  - Dr. Nehemiah Massed: [phone]  - Dr. Laurence Ferrari: [phone]  - Dr. Nicole Kindred: [phone]  In the event of inclement weather, please call our main line at [phone] for an update on the status of any delays or closures.  Dermatology Medication Tips: Please keep the boxes that topical medications come in in order to help keep track of the instructions about where and how to use these. Pharmacies typically print the medication instructions only on the boxes and not directly on the medication tubes.   If your medication is too expensive, please contact our office at [phone] option 4 or send [place] a message through [place].   We are unable to tell what your co-pay for medications will be in advance as this is different depending on your insurance coverage. However, we may be able to find a substitute medication at lower cost or fill out paperwork to get insurance to cover a needed medication.   If a prior authorization is required to get your medication covered by your insurance company, please allow [place] 1-2 business days to complete this process.  Drug prices often vary depending on where the prescription is filled and some pharmacies may offer cheaper prices.  The website www.goodrx.com contains coupons for medications through different pharmacies. The prices here do not account for what the cost may be with help from insurance (it may be cheaper with your insurance), but the website can give you the price if you did not use any insurance.  - You can print the associated coupon and take it with your prescription to the  pharmacy.  - You may also stop by our office during regular business hours and pick up a GoodRx coupon card.  - If you need your prescription sent electronically to a different pharmacy, notify our office through [facility] or by  phone at [phone] option 4.     Si Usted Necesita Algo Despus de Su Visita  Tambin puede enviarnos un mensaje a travs de Pharmacist, community. Por lo general respondemos a los mensajes de MyChart en el transcurso de 1 a 2 das hbiles.  Para renovar recetas, por favor pida a su farmacia que se ponga en contacto con nuestra oficina. Harland Dingwall de fax es [place] [phone].  Si tiene un asunto urgente cuando la clnica est cerrada y que no puede esperar hasta el siguiente da hbil, puede llamar/localizar a su doctor(a) al nmero que aparece a continuacin.   Por favor, tenga en cuenta que aunque hacemos todo lo posible para estar disponibles para asuntos urgentes fuera del horario de [place], no estamos disponibles las 24 horas del da, los 7 das de la [place].   Si tiene un problema urgente y no puede comunicarse con nosotros, puede optar por buscar atencin mdica  en el consultorio de su doctor(a), en una clnica privada, en un centro de atencin urgente o en una sala de emergencias.  Si tiene Engineering geologist, por favor llame inmediatamente al 911 o vaya a la sala de emergencias.  Nmeros de bper  - Dr. Nehemiah Massed: [phone]  - Dra. Moye: [phone]  - Dra. Nicole Kindred: [phone]  En caso de inclemencias del [place], por favor llame a Johnsie Kindred principal al [phone] para una actualizacin sobre el [place] de cualquier retraso o cierre.  Consejos para la medicacin en dermatologa: Por favor, guarde las cajas en las que vienen los medicamentos de uso tpico para ayudarle a seguir las instrucciones sobre dnde y cmo usarlos. Las farmacias generalmente imprimen las instrucciones del medicamento slo en las cajas y no directamente en los  tubos del [place].   Si su medicamento es muy caro, por favor, pngase en contacto con Zigmund Daniel llamando al [phone] y presione la opcin 4 o envenos un mensaje a travs de Pharmacist, community.   No podemos decirle cul ser su copago por los medicamentos por adelantado ya que esto es diferente dependiendo de la cobertura de su seguro. Sin embargo, es posible que podamos encontrar un medicamento sustituto a Electrical engineer un formulario para que el seguro cubra el medicamento que se considera necesario.   Si se requiere una autorizacin previa para que su compaa de seguros [place] su medicamento, por favor permtanos de 1 a 2 das hbiles para completar este proceso.  Los precios de los medicamentos varan con frecuencia dependiendo del Environmental consultant de dnde se surte la receta y alguna farmacias pueden ofrecer precios ms baratos.  El sitio web www.goodrx.com tiene cupones para medicamentos de Airline pilot. Los precios aqu no tienen en cuenta lo que podra costar con la ayuda del seguro (puede ser ms barato con su seguro), pero el sitio web puede darle el precio si no utiliz Research scientist (physical sciences).  - Puede imprimir el cupn correspondiente y llevarlo con su receta a la farmacia.  - Tambin puede pasar por nuestra oficina durante el horario de atencin regular y Charity fundraiser una tarjeta de cupones de GoodRx.  - Si necesita que su receta se enve electrnicamente a una farmacia diferente, informe a nuestra oficina a travs de MyChart de [place] o por telfono llamando al [phone] y presione la opcin 4.

## 2021-12-29 ENCOUNTER — Telehealth: Payer: Self-pay

## 2021-12-29 NOTE — Telephone Encounter (Signed)
Left pt msg to call for bx result/sh °

## 2021-12-29 NOTE — Telephone Encounter (Signed)
-----   Message from Brendolyn Patty, MD sent at 12/29/2021  8:55 AM EST ----- Skin , right upper clavicle WELL DIFFERENTIATED SQUAMOUS CELL CARCINOMA  SCC skin cancer- already treated with EDC at time of biopsy   - please call patient

## 2021-12-31 ENCOUNTER — Telehealth: Payer: Self-pay

## 2021-12-31 NOTE — Telephone Encounter (Signed)
-----   Message from Brendolyn Patty, MD sent at 12/29/2021  8:55 AM EST ----- Skin , right upper clavicle WELL DIFFERENTIATED SQUAMOUS CELL CARCINOMA  SCC skin cancer- already treated with EDC at time of biopsy   - please call patient

## 2021-12-31 NOTE — Telephone Encounter (Signed)
Advised pt of bx results/sh ?

## 2022-02-09 IMAGING — CR DG CHEST 2V
2 series · 2 of 2 positions shown · non-contrast
Comparison: None.

CLINICAL DATA: Dyspnea on exertion

EXAM:
CHEST - 2 VIEW

[chest pa]
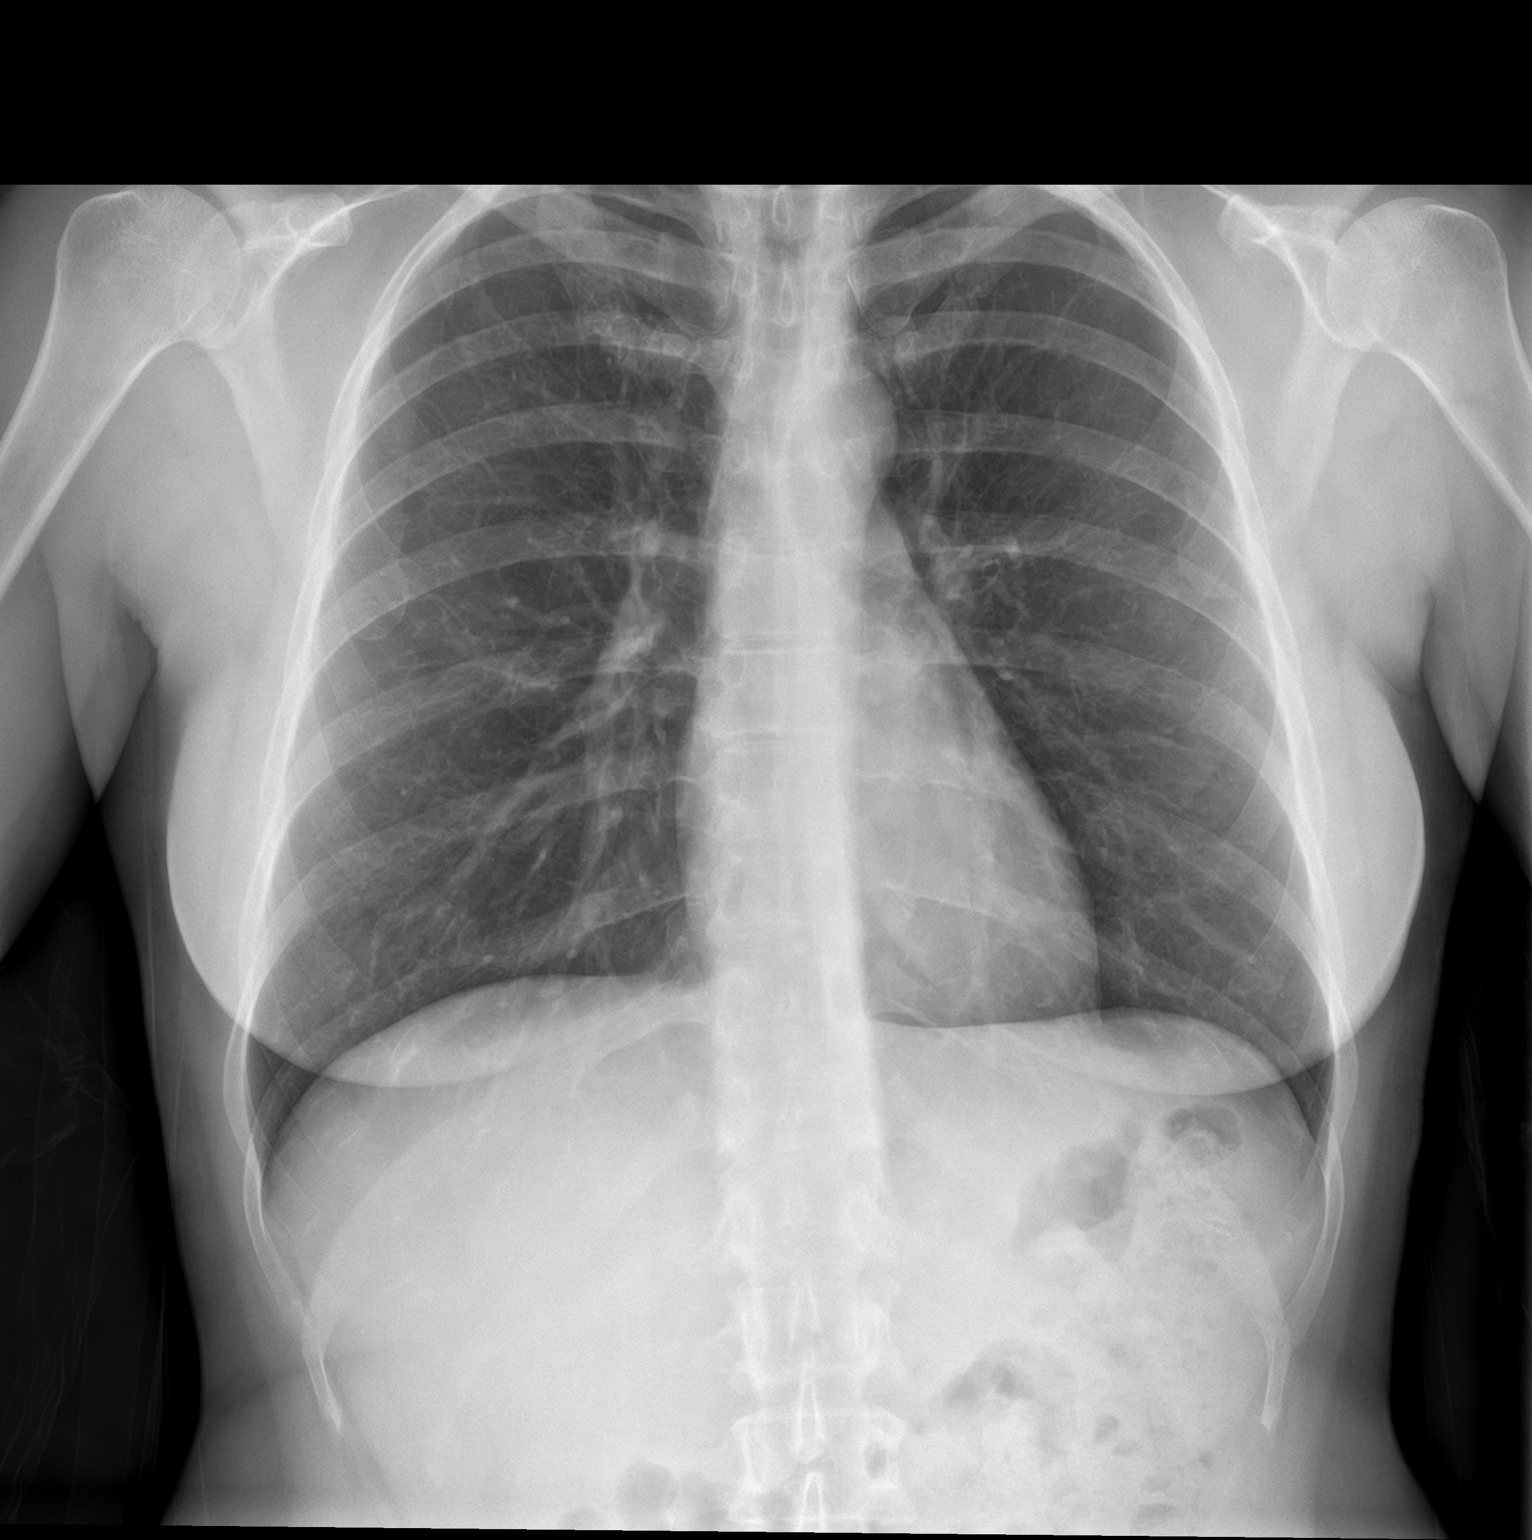

[chest lat]
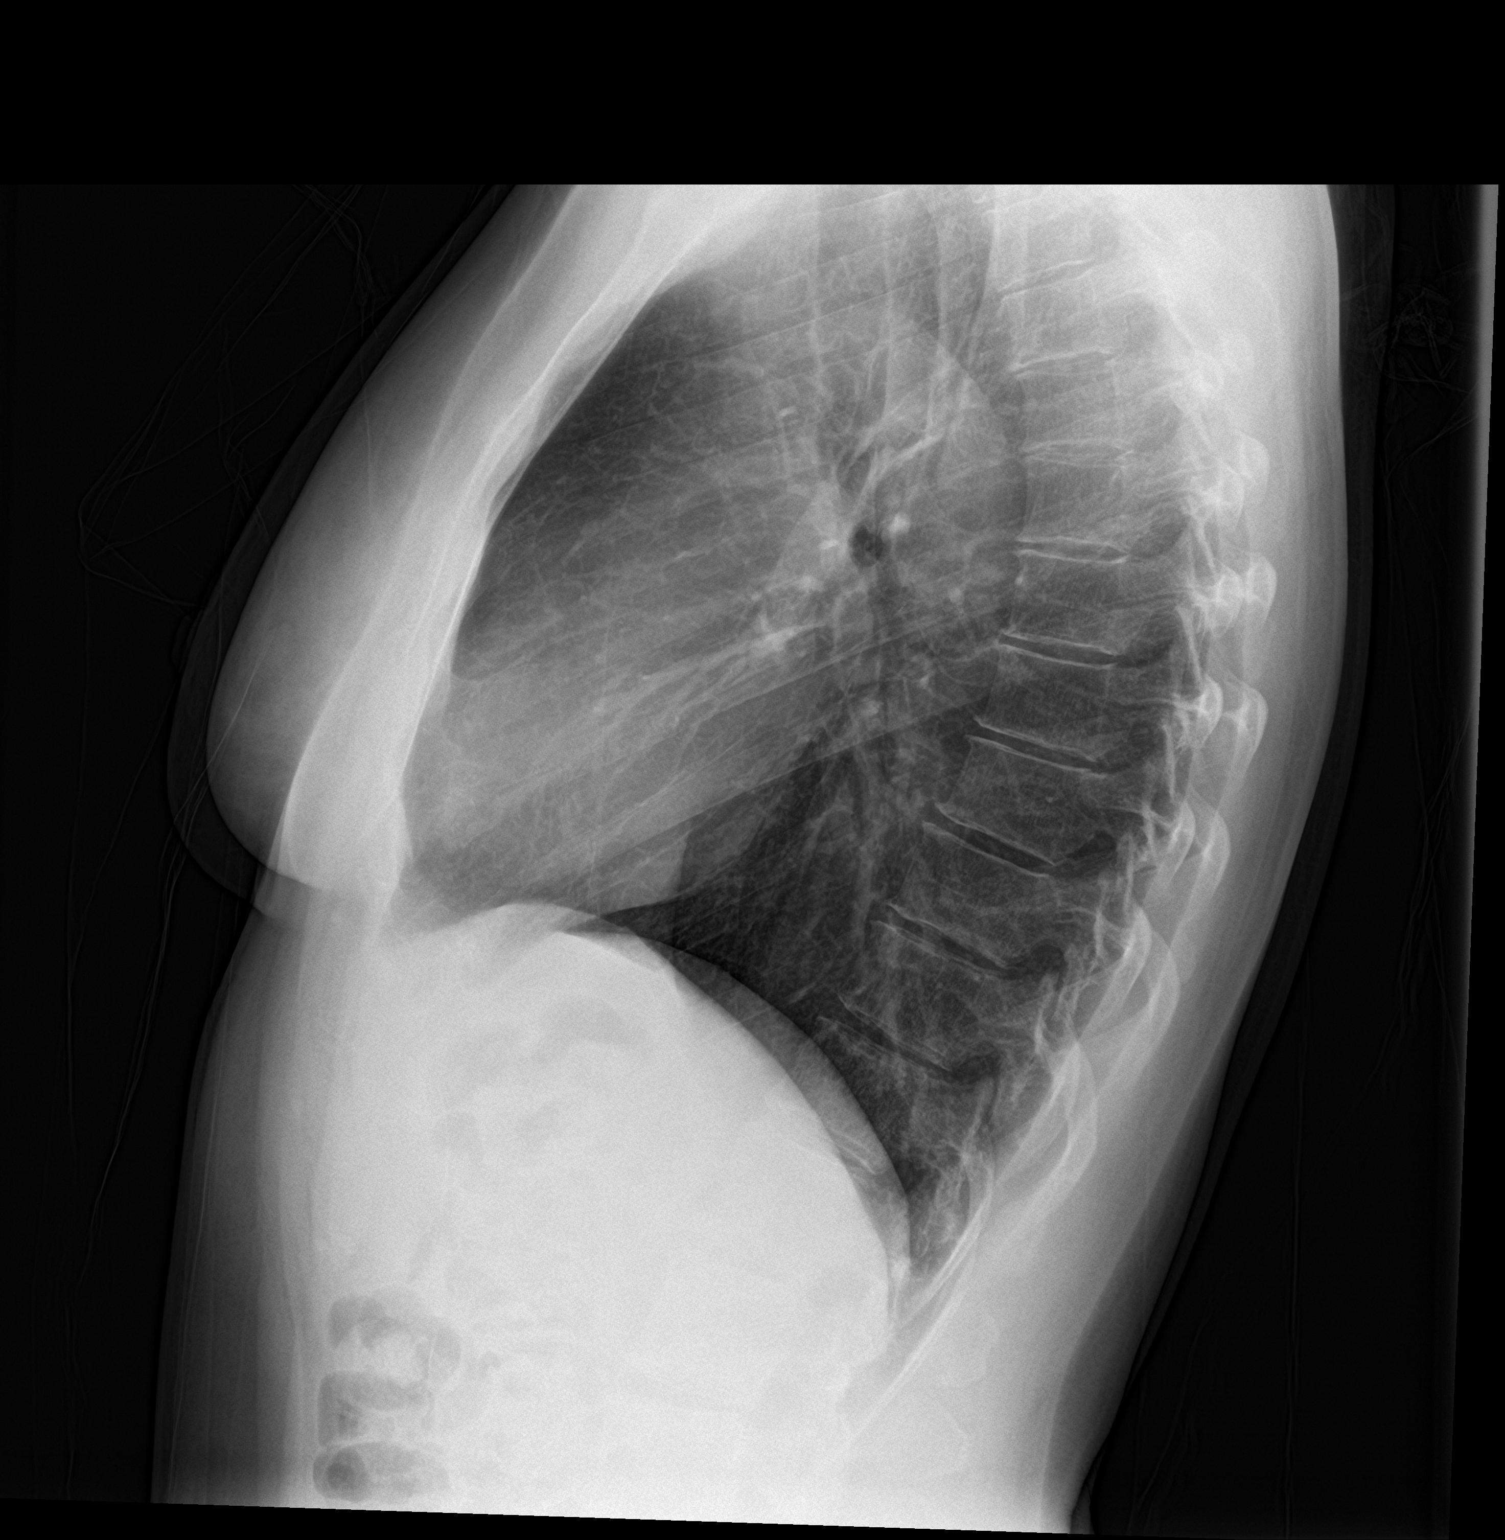

[2 of 2 positions shown; findings below may reference images not displayed]

FINDINGS: The heart size and mediastinal contours are within normal limits.
Both lungs are clear. The visualized skeletal structures are
unremarkable.
IMPRESSION: No active cardiopulmonary disease.

## 2022-03-24 ENCOUNTER — Ambulatory Visit (INDEPENDENT_AMBULATORY_CARE_PROVIDER_SITE_OTHER): Payer: BC Managed Care – PPO | Admitting: Dermatology

## 2022-03-24 VITALS — BP 128/73 | HR 77

## 2022-03-24 DIAGNOSIS — D229 Melanocytic nevi, unspecified: Secondary | ICD-10-CM

## 2022-03-24 DIAGNOSIS — L814 Other melanin hyperpigmentation: Secondary | ICD-10-CM

## 2022-03-24 DIAGNOSIS — C44612 Basal cell carcinoma of skin of right upper limb, including shoulder: Secondary | ICD-10-CM | POA: Diagnosis not present

## 2022-03-24 DIAGNOSIS — L820 Inflamed seborrheic keratosis: Secondary | ICD-10-CM

## 2022-03-24 DIAGNOSIS — L578 Other skin changes due to chronic exposure to nonionizing radiation: Secondary | ICD-10-CM | POA: Diagnosis not present

## 2022-03-24 DIAGNOSIS — D2239 Melanocytic nevi of other parts of face: Secondary | ICD-10-CM | POA: Diagnosis not present

## 2022-03-24 DIAGNOSIS — Z1283 Encounter for screening for malignant neoplasm of skin: Secondary | ICD-10-CM | POA: Diagnosis not present

## 2022-03-24 DIAGNOSIS — D1801 Hemangioma of skin and subcutaneous tissue: Secondary | ICD-10-CM

## 2022-03-24 DIAGNOSIS — C44519 Basal cell carcinoma of skin of other part of trunk: Secondary | ICD-10-CM | POA: Diagnosis not present

## 2022-03-24 DIAGNOSIS — D485 Neoplasm of uncertain behavior of skin: Secondary | ICD-10-CM

## 2022-03-24 DIAGNOSIS — Z86018 Personal history of other benign neoplasm: Secondary | ICD-10-CM

## 2022-03-24 DIAGNOSIS — Z85828 Personal history of other malignant neoplasm of skin: Secondary | ICD-10-CM

## 2022-03-24 DIAGNOSIS — L821 Other seborrheic keratosis: Secondary | ICD-10-CM

## 2022-03-24 NOTE — Patient Instructions (Addendum)
Wound Care Instructions  Cleanse wound gently with soap and water once a day then pat dry with clean gauze. Apply a thin coat of Petrolatum (petroleum jelly, "Vaseline") over the wound (unless you have an allergy to this). We recommend that you use a new, sterile tube of Vaseline. Do not pick or remove scabs. Do not remove the yellow or white "healing tissue" from the base of the wound.  Cover the wound with fresh, clean, nonstick gauze and secure with paper tape. You may use Band-Aids in place of gauze and tape if the wound is small enough, but would recommend trimming much of the tape off as there is often too much. Sometimes Band-Aids can irritate the skin.  You should call the office for your biopsy report after 1 week if you have not already been contacted.  If you experience any problems, such as abnormal amounts of bleeding, swelling, significant bruising, significant pain, or evidence of infection, please call the office immediately.  FOR ADULT SURGERY PATIENTS: If you need something for pain relief you may take 1 extra strength Tylenol (acetaminophen) AND 2 Ibuprofen (200mg each) together every 4 hours as needed for pain. (do not take these if you are allergic to them or if you have a reason you should not take them.) Typically, you may only need pain medication for 1 to 3 days.   Melanoma ABCDEs  Melanoma is the most dangerous type of skin cancer, and is the leading cause of death from skin disease.  You are more likely to develop melanoma if you: Have light-colored skin, light-colored eyes, or red or blond hair Spend a lot of time in the sun Tan regularly, either outdoors or in a tanning bed Have had blistering sunburns, especially during childhood Have a close family member who has had a melanoma Have atypical moles or large birthmarks  Early detection of melanoma is key since treatment is typically straightforward and cure rates are extremely high if we catch it early.   The  first sign of melanoma is often a change in a mole or a new dark spot.  The ABCDE system is a way of remembering the signs of melanoma.  A for asymmetry:  The two halves do not match. B for border:  The edges of the growth are irregular. C for color:  A mixture of colors are present instead of an even brown color. D for diameter:  Melanomas are usually (but not always) greater than 6mm - the size of a pencil eraser. E for evolution:  The spot keeps changing in size, shape, and color.  Please check your skin once per month between visits. You can use a small mirror in front and a large mirror behind you to keep an eye on the back side or your body.   If you see any new or changing lesions before your next follow-up, please call to schedule a visit.  Please continue daily skin protection including broad spectrum sunscreen SPF 30+ to sun-exposed areas, reapplying every 2 hours as needed when you're outdoors.    Due to recent changes in healthcare laws, you may see results of your pathology and/or laboratory studies on MyChart before the doctors have had a chance to review them. We understand that in some cases there may be results that are confusing or concerning to you. Please understand that not all results are received at the same time and often the doctors may need to interpret multiple results in order to provide you   with the best plan of care or course of treatment. Therefore, we ask that you please give us 2 business days to thoroughly review all your results before contacting the office for clarification. Should we see a critical lab result, you will be contacted sooner.   If You Need Anything After Your Visit  If you have any questions or concerns for your doctor, please call our main line at [phone] and press option 4 to reach your doctor's medical assistant. If no one answers, please leave a voicemail as directed and we will return your call as soon as possible. Messages left after 4  pm will be answered the following business day.   You may also send us a message via MyChart. We typically respond to MyChart messages within 1-2 business days.  For prescription refills, please ask your pharmacy to contact our office. Our fax number is [phone].  If you have an urgent issue when the clinic is closed that cannot wait until the next business day, you can page your doctor at the number below.    Please note that while we do our best to be available for urgent issues outside of office hours, we are not available 24/7.   If you have an urgent issue and are unable to reach us, you may choose to seek medical care at your doctor's office, retail clinic, urgent care center, or emergency room.  If you have a medical emergency, please immediately call 911 or go to the emergency department.  Pager Numbers  - Dr. Kowalski: [phone]  - Dr. Moye: [phone]  - Dr. Stewart: [phone]  In the event of inclement weather, please call our main line at [phone] for an update on the status of any delays or closures.  Dermatology Medication Tips: Please keep the boxes that topical medications come in in order to help keep track of the instructions about where and how to use these. Pharmacies typically print the medication instructions only on the boxes and not directly on the medication tubes.   If your medication is too expensive, please contact our office at [phone] option 4 or send us a message through MyChart.   We are unable to tell what your co-pay for medications will be in advance as this is different depending on your insurance coverage. However, we may be able to find a substitute medication at lower cost or fill out paperwork to get insurance to cover a needed medication.   If a prior authorization is required to get your medication covered by your insurance company, please allow us 1-2 business days to complete this process.  Drug prices often vary  depending on where the prescription is filled and some pharmacies may offer cheaper prices.  The website www.goodrx.com contains coupons for medications through different pharmacies. The prices here do not account for what the cost may be with help from insurance (it may be cheaper with your insurance), but the website can give you the price if you did not use any insurance.  - You can print the associated coupon and take it with your prescription to the pharmacy.  - You may also stop by our office during regular business hours and pick up a GoodRx coupon card.  - If you need your prescription sent electronically to a different pharmacy, notify our office through [place] MyChart or by phone at [phone] option 4.     Si Usted Necesita Algo Despus de Su Visita  Tambin puede enviarnos un mensaje a travs   de MyChart. Por lo general respondemos a los mensajes de MyChart en el transcurso de 1 a 2 das hbiles.  Para renovar recetas, por favor pida a su farmacia que se ponga en contacto con nuestra oficina. Nuestro nmero de fax es el [phone].  Si tiene un asunto urgente cuando la clnica est cerrada y que no puede esperar hasta el siguiente da hbil, puede llamar/localizar a su doctor(a) al nmero que aparece a continuacin.   Por favor, tenga en cuenta que aunque hacemos todo lo posible para estar disponibles para asuntos urgentes fuera del horario de oficina, no estamos disponibles las 24 horas del da, los 7 das de la semana.   Si tiene un problema urgente y no puede comunicarse con nosotros, puede optar por buscar atencin mdica  en el consultorio de su doctor(a), en una clnica privada, en un centro de atencin urgente o en una sala de emergencias.  Si tiene una emergencia mdica, por favor llame inmediatamente al 911 o vaya a la sala de emergencias.  Nmeros de bper  - Dr. Kowalski: [phone]  - Dra. Moye: [phone]  - Dra. Stewart: [phone]  En caso de  inclemencias del tiempo, por favor llame a nuestra lnea principal al [phone] para una actualizacin sobre el [place] de cualquier retraso o cierre.  Consejos para la medicacin en dermatologa: Por favor, guarde las cajas en las que vienen los medicamentos de uso tpico para ayudarle a seguir las instrucciones sobre dnde y cmo usarlos. Las farmacias generalmente imprimen las instrucciones del medicamento slo en las cajas y no directamente en los tubos del medicamento.   Si su medicamento es muy caro, por favor, pngase en contacto con nuestra oficina llamando al [phone] y presione la opcin 4 o envenos un mensaje a travs de MyChart.   No podemos decirle cul ser su copago por los medicamentos por adelantado ya que esto es diferente dependiendo de la cobertura de su seguro. Sin embargo, es posible que podamos encontrar un medicamento sustituto a menor costo o llenar un formulario para que el seguro cubra el medicamento que se considera necesario.   Si se requiere una autorizacin previa para que su compaa de seguros cubra su medicamento, por favor permtanos de 1 a 2 das hbiles para completar este proceso.  Los precios de los medicamentos varan con frecuencia dependiendo del lugar de dnde se surte la receta y alguna farmacias pueden ofrecer precios ms baratos.  El sitio web www.goodrx.com tiene cupones para medicamentos de diferentes farmacias. Los precios aqu no tienen en cuenta lo que podra costar con la ayuda del seguro (puede ser ms barato con su seguro), pero el sitio web puede darle el precio si no utiliz ningn seguro.  - Puede imprimir el cupn correspondiente y llevarlo con su receta a la farmacia.  - Tambin puede pasar por nuestra oficina durante el horario de atencin regular y recoger una tarjeta de cupones de GoodRx.  - Si necesita que su receta se enve electrnicamente a una farmacia diferente, informe a nuestra oficina a travs de MyChart de [place] o  por telfono llamando al [phone] y presione la opcin 4.  

## 2022-03-24 NOTE — Progress Notes (Signed)
Follow-Up Visit   Subjective  Kirsten Fletcher is a 52 y.o. female who presents for the following: TBSE (The patient presents for Total-Body Skin Exam (TBSE) for skin cancer screening and mole check.  The patient has spots, moles and lesions to be evaluated, some may be new or changing and the patient has concerns that these could be cancer./). Spot on R post shoulder was frozen in past, but didn't clear.  Has itchy scaly spots on L upper back.  Patient with hx of SCC, BCC, DN.  The following portions of the chart were reviewed this encounter and updated as appropriate:       Review of Systems:  No other skin or systemic complaints except as noted in HPI or Assessment and Plan.  Objective  Well appearing patient in no apparent distress; mood and affect are within normal limits.  A full examination was performed including scalp, head, eyes, ears, nose, lips, neck, chest, axillae, abdomen, back, buttocks, bilateral upper extremities, bilateral lower extremities, hands, feet, fingers, toes, fingernails, and toenails. All findings within normal limits unless otherwise noted below.  L scapula (3) Erythematous stuck-on, waxy papule  right posterior shoulder 0.4 mm pink crusted papule       right nasal dorsum 1.5 firm pink flesh papule    Assessment & Plan  Inflamed seborrheic keratosis (3) L scapula  Vs hypertrophic AK  Symptomatic, irritating, patient would like treated.  Recheck on follow up  Destruction of lesion - L scapula  Destruction method: cryotherapy   Informed consent: discussed and consent obtained   Lesion destroyed using liquid nitrogen: Yes   Region frozen until ice ball extended beyond lesion: Yes   Outcome: patient tolerated procedure well with no complications   Post-procedure details: wound care instructions given   Additional details:  Prior to procedure, discussed risks of blister formation, small wound, skin dyspigmentation, or rare scar following  cryotherapy. Recommend Vaseline ointment to treated areas while healing.   Neoplasm of uncertain behavior of skin right posterior shoulder  Skin / nail biopsy Type of biopsy: tangential   Informed consent: discussed and consent obtained   Patient was prepped and draped in usual sterile fashion: Area prepped with alcohol. Anesthesia: the lesion was anesthetized in a standard fashion   Anesthetic:  1% lidocaine w/ epinephrine 1-100,000 buffered w/ 8.4% NaHCO3 Instrument used: flexible razor blade   Hemostasis achieved with: pressure, aluminum chloride and electrodesiccation   Outcome: patient tolerated procedure well    Destruction of lesion  Destruction method: electrodesiccation and curettage   Informed consent: discussed and consent obtained   Curettage performed in three different directions: Yes   Electrodesiccation performed over the curetted area: Yes   Final wound size (cm):  0.6 Hemostasis achieved with:  pressure, aluminum chloride and electrodesiccation Outcome: patient tolerated procedure well with no complications   Post-procedure details: wound care instructions given   Post-procedure details comment:  Ointment and small bandage applied  Specimen 1 - Surgical pathology Differential Diagnosis: ISK r/o BCC  Check Margins: No 0.4 mm pink crusted papule Tx'd with EDC  Fibrous papule of nose right nasal dorsum  Benign, observe.     History of Dysplastic Nevi - No evidence of recurrence today - Recommend regular full body skin exams - Recommend daily broad spectrum sunscreen SPF 30+ to sun-exposed areas, reapply every 2 hours as needed.  - Call if any new or changing lesions are noted between office visits  History of Basal Cell Carcinoma of the  Skin - No evidence of recurrence today - Recommend regular full body skin exams - Recommend daily broad spectrum sunscreen SPF 30+ to sun-exposed areas, reapply every 2 hours as needed.  - Call if any new or changing  lesions are noted between office visits  History of Squamous Cell Carcinoma of the Skin - No evidence of recurrence today - No lymphadenopathy - Recommend regular full body skin exams - Recommend daily broad spectrum sunscreen SPF 30+ to sun-exposed areas, reapply every 2 hours as needed.  - Call if any new or changing lesions are noted between office visits  Lentigines - Scattered tan macules - Due to sun exposure - Benign-appearing, observe - Recommend daily broad spectrum sunscreen SPF 30+ to sun-exposed areas, reapply every 2 hours as needed. - Call for any changes  Seborrheic Keratoses - Stuck-on, waxy, tan-brown papules and/or plaques  - Benign-appearing - Discussed benign etiology and prognosis. - Observe - Call for any changes  Melanocytic Nevi - Tan-brown and/or pink-flesh-colored symmetric macules and papules - Benign appearing on exam today - Observation - Call clinic for new or changing moles - Recommend daily use of broad spectrum spf 30+ sunscreen to sun-exposed areas.   Hemangiomas - Red papules - Discussed benign nature - Observe - Call for any changes  Actinic Damage - Chronic condition, secondary to cumulative UV/sun exposure - diffuse scaly erythematous macules with underlying dyspigmentation - Recommend daily broad spectrum sunscreen SPF 30+ to sun-exposed areas, reapply every 2 hours as needed.  - Staying in the shade or wearing long sleeves, sun glasses (UVA+UVB protection) and wide brim hats (4-inch brim around the entire circumference of the hat) are also recommended for sun protection.  - Call for new or changing lesions.  Skin cancer screening performed today.  Return in about 1 year (around 03/24/2023) for TBSE, Hx SCC, Hx BCC, Hx Dysplastic Nevi.  Graciella Belton, RMA, am acting as scribe for Brendolyn Patty, MD .  Documentation: I have reviewed the above documentation for accuracy and completeness, and I agree with the above.  Brendolyn Patty  MD

## 2022-04-01 ENCOUNTER — Telehealth: Payer: Self-pay

## 2022-04-01 NOTE — Telephone Encounter (Signed)
Left message for patient to call for biopsy results. 

## 2022-04-01 NOTE — Telephone Encounter (Signed)
-----   Message from Brendolyn Patty, MD sent at 03/31/2022  6:32 PM EDT ----- Skin , right posterior shoulder BASAL CELL CARCINOMA, NODULAR PATTERN  BCC skin cancer- already treated with EDC at time of biopsy    - please call patient

## 2022-04-02 NOTE — Telephone Encounter (Signed)
Left pt msg to call for bx results/sh 

## 2022-04-06 NOTE — Telephone Encounter (Signed)
Left message advising patient biopsy of the right posterior shoulder was BCC and has already been treated with EDC. No further treatment needed. Will recheck at follow-up appointment.

## 2022-10-14 ENCOUNTER — Other Ambulatory Visit: Payer: Self-pay | Admitting: Emergency Medicine

## 2022-10-14 DIAGNOSIS — R200 Anesthesia of skin: Secondary | ICD-10-CM

## 2022-12-01 DIAGNOSIS — Z1151 Encounter for screening for human papillomavirus (HPV): Secondary | ICD-10-CM | POA: Diagnosis not present

## 2022-12-01 DIAGNOSIS — Z01419 Encounter for gynecological examination (general) (routine) without abnormal findings: Secondary | ICD-10-CM | POA: Diagnosis not present

## 2022-12-01 DIAGNOSIS — Z124 Encounter for screening for malignant neoplasm of cervix: Secondary | ICD-10-CM | POA: Diagnosis not present

## 2022-12-01 DIAGNOSIS — Z6821 Body mass index (BMI) 21.0-21.9, adult: Secondary | ICD-10-CM | POA: Diagnosis not present

## 2022-12-01 DIAGNOSIS — N950 Postmenopausal bleeding: Secondary | ICD-10-CM | POA: Diagnosis not present

## 2022-12-29 DIAGNOSIS — N950 Postmenopausal bleeding: Secondary | ICD-10-CM | POA: Diagnosis not present

## 2023-01-19 ENCOUNTER — Other Ambulatory Visit: Payer: Self-pay | Admitting: Obstetrics and Gynecology

## 2023-01-19 DIAGNOSIS — R928 Other abnormal and inconclusive findings on diagnostic imaging of breast: Secondary | ICD-10-CM

## 2023-02-05 ENCOUNTER — Ambulatory Visit
Admission: RE | Admit: 2023-02-05 | Discharge: 2023-02-05 | Disposition: A | Discharge status: Home / Self Care | Payer: BC Managed Care – PPO | Source: Ambulatory Visit | Attending: Obstetrics and Gynecology

## 2023-02-05 ENCOUNTER — Ambulatory Visit
Admission: RE | Admit: 2023-02-05 | Discharge: 2023-02-05 | Disposition: A | Discharge status: Home / Self Care | Payer: BC Managed Care – PPO | Source: Ambulatory Visit | Attending: Obstetrics and Gynecology | Admitting: Obstetrics and Gynecology

## 2023-02-05 DIAGNOSIS — R928 Other abnormal and inconclusive findings on diagnostic imaging of breast: Secondary | ICD-10-CM

## 2023-03-26 ENCOUNTER — Telehealth: Payer: Self-pay | Admitting: Family Medicine

## 2023-03-26 NOTE — Telephone Encounter (Signed)
 Dr Birdie Sons is no longer at this location. Please call the office to schedule a Transfer of Care to either Dr Charlann Lange, Darleen Crocker or Kara Dies, NP. Purvis Sheffield, please schedule this patient for a TOC visit. -KH   Thank you

## 2023-04-20 ENCOUNTER — Ambulatory Visit: Payer: BC Managed Care – PPO | Admitting: Dermatology

## 2023-04-22 ENCOUNTER — Ambulatory Visit: Admitting: Dermatology

## 2023-05-19 ENCOUNTER — Ambulatory Visit
Admission: RE | Admit: 2023-05-19 | Discharge: 2023-05-19 | Disposition: A | Discharge status: Home / Self Care | Source: Ambulatory Visit

## 2023-05-19 VITALS — BP 125/78 | HR 78 | Temp 98.8°F | Resp 18

## 2023-05-19 DIAGNOSIS — R112 Nausea with vomiting, unspecified: Secondary | ICD-10-CM

## 2023-05-19 DIAGNOSIS — R42 Dizziness and giddiness: Secondary | ICD-10-CM | POA: Diagnosis not present

## 2023-05-19 NOTE — Discharge Instructions (Addendum)
 Take the antinausea medication as directed.    Keep yourself hydrated with clear liquids, such as water.  Advance your diet as tolerated.   Go to the emergency department if you have worsening symptoms.    Follow up with your primary care provider.

## 2023-05-19 NOTE — ED Provider Notes (Signed)
 Kirsten Fletcher    CSN: [id number] Arrival date & time: 05/19/23  1141      History   Chief Complaint Chief Complaint  Patient presents with   Nausea    Entered by patient    HPI Kirsten Fletcher is a 53 y.o. female.  Patient presents with nausea, vomiting, dizziness when she lies down, generalized weakness x 3 days.  She describes the dizziness as the sensation of everything spinning when she lies down and closes her eyes.  Last emesis occurred on 05/17/2023.  Patient has been able to drink water without vomiting.  No focal weakness, fever, abdominal pain, diarrhea.  Her symptoms started after she ordered semaglutide online.  She injected her first dose on 05/16/2023 which was 2.5 mg.  She ordered this for weight control.  Patient went to a private "health bar" yesterday and received IV fluids.  She also has been taking Zofran for the nausea and vomiting which she ordered from the same online company that prescribed the semaglutide.  The history is provided by the patient and medical records.    Past Medical History:  Diagnosis Date   Basal cell carcinoma 07/20/2012   Left post deltoid, left chest   Basal cell carcinoma 06/27/2015   right upper back paraspinal, right epigastric   Basal cell carcinoma 01/08/2016   left medial head of clavicle   Basal cell carcinoma 06/05/2020   R lat breast - ED&C    Basal cell carcinoma 03/24/2022   right post shoulder, EDC   Dysplastic nevus 06/27/2015   right mid side   Hyperemesis gravidarum    SCC (squamous cell carcinoma) 12/16/2021   right upper clavicle, EDC   Squamous cell carcinoma of skin 10/05/2013   left sternum    Patient Active Problem List   Diagnosis Date Noted   Dyspnea on exertion 03/26/2019   Constipation 09/12/2018   Rhinitis 05/03/2017   Malaise 10/28/2016   Palpitations 08/12/2016   Routine general medical examination at a health care facility 05/31/2015   Shellfish allergy 05/01/2015    Past Surgical  History:  Procedure Laterality Date   AUGMENTATION MAMMAPLASTY      OB History     Gravida  2   Para      Term      Preterm      AB      Living         SAB      IAB      Ectopic      Multiple      Live Births           Obstetric Comments  Hyperemesis gravidarum          Home Medications    Prior to Admission medications   Medication Sig Start Date End Date Taking? Authorizing Provider  ondansetron (ZOFRAN-ODT) 4 MG disintegrating tablet Take 4 mg by mouth every 8 (eight) hours as needed. 05/03/23  Yes [provider]  progesterone (PROMETRIUM) 200 MG capsule Take 200 mg by mouth daily.    [provider]    Family History Family History  Problem Relation Age of Onset   Diabetes Mother    Diabetes Father    Breast cancer Maternal Aunt    Heart disease Maternal Aunt    Diabetes Maternal Grandmother    Diabetes Maternal Grandfather    Diabetes Paternal Grandmother    Diabetes Paternal Grandfather    Diabetes Brother    Colon cancer Neg  Hx     Social History Social History   Tobacco Use   Smoking status: Never   Smokeless tobacco: Never  Vaping Use   Vaping status: Never Used  Substance Use Topics   Drug use: No     Allergies   Shellfish allergy and Latex   Review of Systems Review of Systems  Constitutional:  Negative for chills and fever.  Gastrointestinal:  Positive for nausea and vomiting. Negative for abdominal pain and diarrhea.  Neurological:  Positive for dizziness and weakness. Negative for syncope and numbness.     Physical Exam Triage Vital Signs ED Triage Vitals  Encounter Vitals Group     BP 05/19/23 1153 125/78     Systolic BP Percentile --      Diastolic BP Percentile --      Pulse Rate 05/19/23 1153 78     Resp 05/19/23 1153 18     Temp 05/19/23 1153 98.8 F (37.1 C)     Temp src --      SpO2 05/19/23 1153 98 %     Weight --      Height --      Head Circumference --      Peak Flow --       Pain Score 05/19/23 1152 2     Pain Loc --      Pain Education --      Exclude from Growth Chart --    Orthostatic VS for the past 24 hrs:  BP- Lying Pulse- Lying BP- Sitting Pulse- Sitting BP- Standing at 0 minutes Pulse- Standing at 0 minutes  05/19/23 1245 127/79 77 134/81 79 147/80 87    Updated Vital Signs BP 125/78   Pulse 78   Temp 98.8 F (37.1 C)   Resp 18   LMP 09/01/2019 (Exact Date)   SpO2 98%   Visual Acuity Right Eye Distance:   Left Eye Distance:   Bilateral Distance:    Right Eye Near:   Left Eye Near:    Bilateral Near:     Physical Exam Constitutional:      General: She is not in acute distress. HENT:     Mouth/Throat:     Mouth: Mucous membranes are moist.  Cardiovascular:     Rate and Rhythm: Normal rate and regular rhythm.     Heart sounds: Normal heart sounds.  Pulmonary:     Effort: Pulmonary effort is normal. No respiratory distress.     Breath sounds: Normal breath sounds.  Abdominal:     General: Bowel sounds are normal.     Palpations: Abdomen is soft.     Tenderness: There is no abdominal tenderness. There is no guarding or rebound.  Neurological:     General: No focal deficit present.     Mental Status: She is alert and oriented to person, place, and time.     Sensory: No sensory deficit.     Motor: No weakness.     Gait: Gait normal.      UC Treatments / Results  Labs (all labs ordered are listed, but only abnormal results are displayed) Labs Reviewed - No data to display  EKG   Radiology No results found.  Procedures Procedures (including critical care time)  Medications Ordered in UC Medications - No data to display  Initial Impression / Assessment and Plan / UC Course  I have reviewed the triage vital signs and the nursing notes.  Pertinent labs & imaging results that were available  during my care of the patient were reviewed by me and considered in my medical decision making (see chart for details).    Dizziness, nausea and vomiting.  Afebrile and vital signs are stable.  EKG shows sinus rhythm, rate 84, no ST elevation, compared to previous from 2022.  Orthostatic vital signs stable.  Abdomen is soft and nontender with good bowel sounds.  Patient's symptoms started after she gave herself the first dose of semaglutide that she ordered online; the dose was 2.5 mg.  She has Zofran that was filled at a local pharmacy which was prescribed by the online service that prescribed the semaglutide.  Instructed her to continue the Zofran and to keep herself hydrated.  ED precautions given.  Instructed her to follow-up with her PCP.  Instructed her to discontinue the semaglutide which she is more than agreeable to.  Patient agrees to plan of care.  Final Clinical Impressions(s) / UC Diagnoses   Final diagnoses:  Dizziness  Nausea and vomiting, unspecified vomiting type     Discharge Instructions      Take the antinausea medication as directed.    Keep yourself hydrated with clear liquids, such as water.  Advance your diet as tolerated.   Go to the emergency department if you have worsening symptoms.    Follow up with your primary care provider.          ED Prescriptions   None    PDMP not reviewed this encounter.   Wellington Half, NP 05/19/23 1308

## 2023-05-19 NOTE — ED Triage Notes (Addendum)
 Patient to Urgent Care with complaints of vomiting/ nausea. Denies any diarrhea/ No fevers. Symptoms started on Sunday.   Able to tolerate water. Taking 4mg  ODT Zofran.  Reports symptoms started after eating [employer] and taking inj Semaglutide- Carnitine  3.4mg / . (injected 0.07mL).

## 2023-11-16 ENCOUNTER — Encounter: Payer: Self-pay | Admitting: Dermatology

## 2023-11-16 ENCOUNTER — Ambulatory Visit: Admitting: Dermatology

## 2023-11-16 DIAGNOSIS — D492 Neoplasm of unspecified behavior of bone, soft tissue, and skin: Secondary | ICD-10-CM

## 2023-11-16 DIAGNOSIS — W908XXA Exposure to other nonionizing radiation, initial encounter: Secondary | ICD-10-CM

## 2023-11-16 DIAGNOSIS — L814 Other melanin hyperpigmentation: Secondary | ICD-10-CM

## 2023-11-16 DIAGNOSIS — L820 Inflamed seborrheic keratosis: Secondary | ICD-10-CM

## 2023-11-16 DIAGNOSIS — L821 Other seborrheic keratosis: Secondary | ICD-10-CM

## 2023-11-16 DIAGNOSIS — Z1283 Encounter for screening for malignant neoplasm of skin: Secondary | ICD-10-CM | POA: Diagnosis not present

## 2023-11-16 DIAGNOSIS — L570 Actinic keratosis: Secondary | ICD-10-CM

## 2023-11-16 DIAGNOSIS — D2339 Other benign neoplasm of skin of other parts of face: Secondary | ICD-10-CM

## 2023-11-16 DIAGNOSIS — L578 Other skin changes due to chronic exposure to nonionizing radiation: Secondary | ICD-10-CM | POA: Diagnosis not present

## 2023-11-16 DIAGNOSIS — D225 Melanocytic nevi of trunk: Secondary | ICD-10-CM

## 2023-11-16 DIAGNOSIS — Z86018 Personal history of other benign neoplasm: Secondary | ICD-10-CM

## 2023-11-16 DIAGNOSIS — Z8589 Personal history of malignant neoplasm of other organs and systems: Secondary | ICD-10-CM

## 2023-11-16 DIAGNOSIS — D229 Melanocytic nevi, unspecified: Secondary | ICD-10-CM

## 2023-11-16 DIAGNOSIS — Z85828 Personal history of other malignant neoplasm of skin: Secondary | ICD-10-CM

## 2023-11-16 NOTE — Patient Instructions (Addendum)

## 2023-11-16 NOTE — Progress Notes (Unsigned)
 Follow-Up Visit   Subjective  Kirsten Fletcher is a 53 y.o. female who presents for the following: Skin Cancer Screening and Full Body Skin Exam hx of BCCs, SCCs, Dysplastic Nevus, Aks, check spot back, over the summer it would flake off and hx of bleeding  The patient presents for Total-Body Skin Exam (TBSE) for skin cancer screening and mole check. The patient has spots, moles and lesions to be evaluated, some may be new or changing and the patient may have concern these could be cancer.  The following portions of the chart were reviewed this encounter and updated as appropriate: medications, allergies, medical history  Review of Systems:  No other skin or systemic complaints except as noted in HPI or Assessment and Plan.  Objective  Well appearing patient in no apparent distress; mood and affect are within normal limits.  A full examination was performed including scalp, head, eyes, ears, nose, lips, neck, chest, axillae, abdomen, back, buttocks, bilateral upper extremities, bilateral lower extremities, hands, feet, fingers, toes, fingernails, and toenails. All findings within normal limits unless otherwise noted below.   Relevant physical exam findings are noted in the Assessment and Plan.  L zygoma ant to sideburn x 1, R nose above fibrous pap x 1, L top of shoulder x 1, L upper back paraspinal x 1 (4) Pink scaly macules  R chest above breast x 2 (2) Stuck on waxy paps with erythema R ant waistline 0.6cm irregular brown macule   Assessment & Plan   SKIN CANCER SCREENING PERFORMED TODAY.  ACTINIC DAMAGE - Chronic condition, secondary to cumulative UV/sun exposure - diffuse scaly erythematous macules with underlying dyspigmentation - Recommend daily broad spectrum sunscreen SPF 30+ to sun-exposed areas, reapply every 2 hours as needed.  - Staying in the shade or wearing long sleeves, sun glasses (UVA+UVB protection) and wide brim hats (4-inch brim around the entire circumference  of the hat) are also recommended for sun protection.  - Call for new or changing lesions.  LENTIGINES, SEBORRHEIC KERATOSES, HEMANGIOMAS - Benign normal skin lesions - Benign-appearing - Call for any changes  MELANOCYTIC NEVI - Tan-brown and/or pink-flesh-colored symmetric macules and papules - Benign appearing on exam today - Observation - Call clinic for new or changing moles - Recommend daily use of broad spectrum spf 30+ sunscreen to sun-exposed areas.   HISTORY OF BASAL CELL CARCINOMA OF THE SKIN - No evidence of recurrence today - Recommend regular full body skin exams - Recommend daily broad spectrum sunscreen SPF 30+ to sun-exposed areas, reapply every 2 hours as needed.  - Call if any new or changing lesions are noted between office visits  - L post deltoid, L chest, R upper back paraspinal, R epigastric, L medial head of clavicle, R lat breast, R post shoulder  HISTORY OF SQUAMOUS CELL CARCINOMA OF THE SKIN - No evidence of recurrence today - No lymphadenopathy - Recommend regular full body skin exams - Recommend daily broad spectrum sunscreen SPF 30+ to sun-exposed areas, reapply every 2 hours as needed.  - Call if any new or changing lesions are noted between office visits - R upper clavicle, L sternum  HISTORY OF DYSPLASTIC NEVUS No evidence of recurrence today Recommend regular full body skin exams Recommend daily broad spectrum sunscreen SPF 30+ to sun-exposed areas, reapply every 2 hours as needed.  Call if any new or changing lesions are noted between office visits  - R mid sole  Angiofibroma/Fibrous Papule R nasal dorsum/R nose supratip - 1-2  mm smooth symmetric flesh colored to pink papule(s) without features suspicious for malignancy on dermoscopy - Benign-appearing.  Observation.  Call clinic for new or changing lesions.    AK (ACTINIC KERATOSIS) (4) L zygoma ant to sideburn x 1, R nose above fibrous pap x 1, L top of shoulder x 1, L upper back  paraspinal x 1 (4) Recheck L top of shoulder, L upper back paraspinal   Actinic keratoses are precancerous spots that appear secondary to cumulative UV radiation exposure/sun exposure over time. They are chronic with expected duration over 1 year. A portion of actinic keratoses will progress to squamous cell carcinoma of the skin. It is not possible to reliably predict which spots will progress to skin cancer and so treatment is recommended to prevent development of skin cancer.  Recommend daily broad spectrum sunscreen SPF 30+ to sun-exposed areas, reapply every 2 hours as needed.  Recommend staying in the shade or wearing long sleeves, sun glasses (UVA+UVB protection) and wide brim hats (4-inch brim around the entire circumference of the hat). Call for new or changing lesions. Destruction of lesion - L zygoma ant to sideburn x 1, R nose above fibrous pap x 1, L top of shoulder x 1, L upper back paraspinal x 1 (4) Complexity: simple   Destruction method: cryotherapy   Informed consent: discussed and consent obtained   Timeout:  patient name, date of birth, surgical site, and procedure verified Lesion destroyed using liquid nitrogen: Yes   Region frozen until ice ball extended beyond lesion: Yes   Outcome: patient tolerated procedure well with no complications   Post-procedure details: wound care instructions given    INFLAMED SEBORRHEIC KERATOSIS (2) R chest above breast x 2 (2) Symptomatic, irritating, patient would like treated. Destruction of lesion - R chest above breast x 2 (2) Complexity: simple   Destruction method: cryotherapy   Informed consent: discussed and consent obtained   Timeout:  patient name, date of birth, surgical site, and procedure verified Lesion destroyed using liquid nitrogen: Yes   Region frozen until ice ball extended beyond lesion: Yes   Outcome: patient tolerated procedure well with no complications   Post-procedure details: wound care instructions given     NEOPLASM OF SKIN R ant waistline Epidermal / dermal shaving  Lesion diameter (cm):  0.6 Informed consent: discussed and consent obtained   Timeout: patient name, date of birth, surgical site, and procedure verified   Procedure prep:  Patient was prepped and draped in usual sterile fashion Prep type:  Isopropyl alcohol Anesthesia: the lesion was anesthetized in a standard fashion   Anesthetic:  1% lidocaine w/ epinephrine  1-100,000 buffered w/ 8.4% NaHCO3 Instrument used: flexible razor blade   Hemostasis achieved with: pressure, aluminum chloride and electrodesiccation   Outcome: patient tolerated procedure well   Post-procedure details: sterile dressing applied and wound care instructions given   Dressing type: bandage and petrolatum    Specimen 1 - Surgical pathology Differential Diagnosis: Nevus vs Dysplastic Nevus  Check Margins: yes 0.6cm irregular brown macule Return in 6 months (on 05/15/2024) for UBSE recheck AKs L top of shoulder, L upper back paraspinal, hx of BCC, SCC, Dysplastic, AK.  I, Sonya Hupman, RMA, am acting as scribe for Alm Rhyme, MD .   Documentation: I have reviewed the above documentation for accuracy and completeness, and I agree with the above.  Alm Rhyme, MD

## 2023-11-17 ENCOUNTER — Encounter: Payer: Self-pay | Admitting: Dermatology

## 2023-11-19 LAB — SURGICAL PATHOLOGY

## 2023-11-22 ENCOUNTER — Ambulatory Visit: Payer: Self-pay | Admitting: Dermatology

## 2023-11-23 ENCOUNTER — Encounter: Payer: Self-pay | Admitting: Dermatology

## 2023-11-23 NOTE — Telephone Encounter (Addendum)
 Patient returned call. Discussed bx results with patient . She verbalized understanding and denied further questions. Will recheck at follow up----- Message from Alm Rhyme sent at 11/22/2023  5:30 PM EST ----- FINAL DIAGNOSIS        1. Skin, right ant waistline :       DYSPLASTIC COMPOUND NEVUS WITH MODERATE ATYPIA, CLOSE TO MARGIN    Moderate Dysplastic Recheck next visit ----- Message ----- From: Interface, [facility] In [facility] Sent: 11/19/2023   5:42 PM EST To: Alm JAYSON Rhyme, MD

## 2024-05-18 ENCOUNTER — Ambulatory Visit: Admitting: Dermatology
